# Patient Record
Sex: Female | Born: 1965 | Race: White | Hispanic: No | Marital: Married | Smoking: Never smoker
Health system: Southern US, Community
[De-identification: ages and names within clinical notes are randomized; demographics above are authoritative.]

## PROBLEM LIST (undated history)

## (undated) ENCOUNTER — Emergency Department (HOSPITAL_BASED_OUTPATIENT_CLINIC_OR_DEPARTMENT_OTHER): Admission: EM | Discharge status: Against Medical Advice | Payer: BC Managed Care – PPO | Source: Home / Self Care

## (undated) DIAGNOSIS — F32A Depression, unspecified: Secondary | ICD-10-CM

## (undated) DIAGNOSIS — F419 Anxiety disorder, unspecified: Secondary | ICD-10-CM

## (undated) DIAGNOSIS — R32 Unspecified urinary incontinence: Secondary | ICD-10-CM

## (undated) DIAGNOSIS — E349 Endocrine disorder, unspecified: Secondary | ICD-10-CM

## (undated) DIAGNOSIS — I10 Essential (primary) hypertension: Secondary | ICD-10-CM

## (undated) DIAGNOSIS — F329 Major depressive disorder, single episode, unspecified: Secondary | ICD-10-CM

## (undated) HISTORY — DX: Essential (primary) hypertension: I10

## (undated) HISTORY — DX: Major depressive disorder, single episode, unspecified: F32.9

## (undated) HISTORY — PX: TONSILLECTOMY: SUR1361

## (undated) HISTORY — DX: Unspecified urinary incontinence: R32

## (undated) HISTORY — DX: Anxiety disorder, unspecified: F41.9

## (undated) HISTORY — DX: Depression, unspecified: F32.A

## (undated) HISTORY — DX: Endocrine disorder, unspecified: E34.9

---
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
## 2000-11-23 HISTORY — PX: TOTAL VAGINAL HYSTERECTOMY: SHX2548

## 2000-11-23 HISTORY — PX: ABDOMINAL HYSTERECTOMY: SHX81

## 2012-10-19 ENCOUNTER — Encounter (HOSPITAL_COMMUNITY): Payer: Self-pay | Admitting: Emergency Medicine

## 2012-10-19 ENCOUNTER — Emergency Department (HOSPITAL_COMMUNITY)
Admission: EM | Admit: 2012-10-19 | Discharge: 2012-10-20 | Disposition: A | Discharge status: Home / Self Care | Payer: PRIVATE HEALTH INSURANCE | Attending: Emergency Medicine | Admitting: Emergency Medicine

## 2012-10-19 DIAGNOSIS — N201 Calculus of ureter: Secondary | ICD-10-CM | POA: Insufficient documentation

## 2012-10-19 DIAGNOSIS — R110 Nausea: Secondary | ICD-10-CM | POA: Insufficient documentation

## 2012-10-19 LAB — COMPREHENSIVE METABOLIC PANEL
ALT: 15 U/L (ref 0–35)
AST: 30 U/L (ref 0–37)
Alkaline Phosphatase: 85 U/L (ref 39–117)
CO2: 26 mEq/L (ref 19–32)
Chloride: 102 mEq/L (ref 96–112)
GFR calc non Af Amer: 58 mL/min — ABNORMAL LOW (ref >90)
Sodium: 141 mEq/L (ref 135–145)
Total Bilirubin: 0.2 mg/dL — ABNORMAL LOW (ref 0.3–1.2)

## 2012-10-19 LAB — CBC WITH DIFFERENTIAL/PLATELET
Basophils Absolute: 0 10*3/uL (ref 0.0–0.1)
HCT: 39.6 % (ref 36.0–46.0)
Lymphocytes Relative: 13 % (ref 12–46)
Monocytes Absolute: 0.6 10*3/uL (ref 0.1–1.0)
Neutro Abs: 10.8 10*3/uL — ABNORMAL HIGH (ref 1.7–7.7)
Platelets: 263 10*3/uL (ref 150–400)
RDW: 12.8 % (ref 11.5–15.5)
WBC: 13.1 10*3/uL — ABNORMAL HIGH (ref 4.0–10.5)

## 2012-10-19 MED ORDER — HYDROMORPHONE HCL PF 1 MG/ML IJ SOLN
0.5000 mg | Freq: Once | INTRAMUSCULAR | Status: AC
  Administered 2012-10-19: 0.5 mg via INTRAVENOUS
  Filled 2012-10-19: qty 1

## 2012-10-19 MED ORDER — SODIUM CHLORIDE 0.9 % IV SOLN
INTRAVENOUS | Status: DC
  Administered 2012-10-19: via INTRAVENOUS

## 2012-10-19 MED ORDER — ONDANSETRON HCL 4 MG/2ML IJ SOLN
4.0000 mg | Freq: Once | INTRAMUSCULAR | Status: AC
  Administered 2012-10-19: 4 mg via INTRAVENOUS
  Filled 2012-10-19: qty 2

## 2012-10-19 NOTE — ED Notes (Signed)
Patient complaining of right sided flank pain since Saturday.  Patient was seen by Urgent Care on Saturday; had urinalysis done -- was placed on an antibiotic.  Patient states that pain increasingly became worse today around 2200.  Patient denies history of kidney stones.

## 2012-10-19 NOTE — ED Provider Notes (Signed)
History     CSN: [id number]  Arrival date & time 10/19/12  2224   First MD Initiated Contact with Patient 10/19/12 2254      Chief Complaint  Patient presents with  . Flank Pain    (Consider location/radiation/quality/duration/timing/severity/associated sxs/prior treatment) Patient is a 46 y.o. female presenting with flank pain. The history is provided by the patient.  Flank Pain This is a new problem. Pertinent negatives include no chest pain, no abdominal pain, no headaches and no shortness of breath.   patient has had right back/flank pain for the last 5 days. She seen at urgent care and started on antibiotics. She was told she may have a kidney stone. She's continued to have pain. She's not had any dysuria. No frequency. She states she has had some difficulty urinating. No fevers. She has some nauseousness with the pain. The pain is severe and crampy. No diarrhea. No constipation. She's never previously had kidney stones.  History reviewed. No pertinent past medical history.  Past Surgical History  Procedure Date  . Tonsillectomy   . Abdominal hysterectomy     History reviewed. No pertinent family history.  History  Substance Use Topics  . Smoking status: Never Smoker   . Smokeless tobacco: Not on file  . Alcohol Use: Yes    OB History    Grav Para Term Preterm Abortions TAB SAB Ect Mult Living                  Review of Systems  Constitutional: Negative for activity change and appetite change.  HENT: Negative for neck stiffness.   Eyes: Negative for pain.  Respiratory: Negative for chest tightness and shortness of breath.   Cardiovascular: Negative for chest pain and leg swelling.  Gastrointestinal: Positive for nausea. Negative for vomiting, abdominal pain and diarrhea.  Genitourinary: Positive for flank pain. Negative for hematuria, vaginal bleeding and vaginal discharge.  Musculoskeletal: Negative for back pain.  Skin: Negative for rash.  Neurological:  Negative for weakness, numbness and headaches.  Psychiatric/Behavioral: Negative for behavioral problems.    Allergies  Review of patient's allergies indicates no known allergies.  Home Medications   Current Outpatient Rx  Name  Route  Sig  Dispense  Refill  . CIPROFLOXACIN HCL 500 MG PO TABS   Oral   Take 500 mg by mouth 2 (two) times daily. 5 pills left         . CITALOPRAM HYDROBROMIDE 20 MG PO TABS   Oral   Take 20 mg by mouth daily.         Marland Kitchen HYDROCHLOROTHIAZIDE 25 MG PO TABS   Oral   Take 25 mg by mouth daily.         Marland Kitchen HYDROCODONE-ACETAMINOPHEN 5-500 MG PO TABS   Oral   Take 1 tablet by mouth every 6 (six) hours as needed. For pain         . ONDANSETRON 8 MG PO TBDP   Oral   Take 1 tablet (8 mg total) by mouth every 8 (eight) hours as needed for nausea.   10 tablet   0   . OXYCODONE-ACETAMINOPHEN 5-325 MG PO TABS   Oral   Take 1-2 tablets by mouth every 6 (six) hours as needed for pain.   20 tablet   0   . TAMSULOSIN HCL 0.4 MG PO CAPS   Oral   Take 1 capsule (0.4 mg total) by mouth daily.   7 capsule   0  BP 139/80  Pulse 109  Temp 97.9 F (36.6 C) (Oral)  Resp 20  SpO2 99%  Physical Exam  Nursing note and vitals reviewed. Constitutional: She is oriented to person, place, and time. She appears well-developed and well-nourished.       Patient was standing in the room and appears uncomfortable.   HENT:  Head: Normocephalic and atraumatic.  Eyes: EOM are normal. Pupils are equal, round, and reactive to light.  Neck: Normal range of motion. Neck supple.  Cardiovascular: Normal rate, regular rhythm and normal heart sounds.   No murmur heard. Pulmonary/Chest: Effort normal and breath sounds normal. No respiratory distress. She has no wheezes. She has no rales.  Abdominal: Soft. Bowel sounds are normal. She exhibits no distension. There is no tenderness. There is no rebound and no guarding.  Genitourinary:       No CVA tenderness. No rash   Musculoskeletal: Normal range of motion.  Neurological: She is alert and oriented to person, place, and time. No cranial nerve deficit.  Skin: Skin is warm and dry.  Psychiatric: She has a normal mood and affect. Her speech is normal.    ED Course  Procedures (including critical care time)  Labs Reviewed  CBC WITH DIFFERENTIAL - Abnormal; Notable for the following:    WBC 13.1 (*)     Neutrophils Relative 82 (*)     Neutro Abs 10.8 (*)     All other components within normal limits  COMPREHENSIVE METABOLIC PANEL - Abnormal; Notable for the following:    Glucose, Bld 114 (*)     BUN 25 (*)     Creatinine, Ser 1.11 (*)     Total Bilirubin 0.2 (*)     GFR calc non Af Amer 58 (*)     GFR calc Af Amer 67 (*)     All other components within normal limits  URINALYSIS, ROUTINE W REFLEX MICROSCOPIC - Abnormal; Notable for the following:    Hgb urine dipstick SMALL (*)     Ketones, ur 15 (*)     All other components within normal limits  URINE MICROSCOPIC-ADD ON - Abnormal; Notable for the following:    Squamous Epithelial / LPF FEW (*)     All other components within normal limits  URINE CULTURE   Ct Abdomen Pelvis Wo Contrast  10/20/2012  *RADIOLOGY REPORT*  Clinical Data: Right-sided flank pain.  CT ABDOMEN AND PELVIS WITHOUT CONTRAST  Technique:  Multidetector CT imaging of the abdomen and pelvis was performed following the standard protocol without intravenous contrast.  Comparison: No priors.  Findings:  Lung Bases: Unremarkable.  Abdomen/Pelvis:  Image 47 of series 2 demonstrates a 4 mm calculus in the proximal third of the right ureter just distal to the right ureteropelvic junction.  This is associated with mild right-sided hydronephrosis and perinephric stranding.  There is also a 3 mm nonobstructive calculus in the interpolar region of the left kidney.  The unenhanced appearance of the liver, gallbladder, radius, spleen and bilateral adrenal glands is unremarkable.  A tiny  umbilical hernia containing only a small amount of omental fat.  No ascites or pneumoperitoneum and no pathologic distension of small bowel. No definite pathologic lymphadenopathy in the pelvis on this noncontrast CT examination.  Status post hysterectomy.  Ovaries are unremarkable in appearance.  Urinary bladder is normal in appearance.  Musculoskeletal: There are no aggressive appearing lytic or blastic lesions noted in the visualized portions of the skeleton.  IMPRESSION: 1.  Partially obstructive  4 mm calculus in the proximal third of the right ureter (just distal to the ureteropelvic junction) with mild hydronephrosis and perinephric stranding indicative of mild obstruction at this time. 2.  3 mm nonobstructive calculus of the interpolar collecting system of the left kidney. 3.  Tiny umbilical hernia containing only a small amount of omental fat. 4.  Normal appendix.   Original Report Authenticated By: Trudie Reed, M.D.      1. Right ureteral stone       MDM  Patient with flank pain. Can't have a ureteral stone on CT. Pain was poorly controlled with Dilaudid. After discussion with urology since it is a moderately sized proximal stone Toradol was given. Patient feels better and feels as if the pain will be able to be managed at home. She'll be discharged with Percocet, Flomax, and Zofran. She will followup as needed.        Juliet Rude. Rubin Payor, MD 10/20/12 0500

## 2012-10-20 ENCOUNTER — Encounter (HOSPITAL_COMMUNITY): Payer: Self-pay | Admitting: Radiology

## 2012-10-20 ENCOUNTER — Emergency Department (HOSPITAL_COMMUNITY): Payer: PRIVATE HEALTH INSURANCE

## 2012-10-20 LAB — URINALYSIS, ROUTINE W REFLEX MICROSCOPIC
Bilirubin Urine: NEGATIVE
Glucose, UA: NEGATIVE mg/dL
Ketones, ur: 15 mg/dL — AB
Leukocytes, UA: NEGATIVE
Nitrite: NEGATIVE
Protein, ur: NEGATIVE mg/dL
Specific Gravity, Urine: 1.023 (ref 1.005–1.030)
Urobilinogen, UA: 0.2 mg/dL (ref 0.0–1.0)
pH: 5 (ref 5.0–8.0)

## 2012-10-20 LAB — URINE MICROSCOPIC-ADD ON

## 2012-10-20 MED ORDER — HYDROMORPHONE HCL PF 1 MG/ML IJ SOLN
1.0000 mg | Freq: Once | INTRAMUSCULAR | Status: AC
  Administered 2012-10-20: 1 mg via INTRAVENOUS
  Filled 2012-10-20: qty 1

## 2012-10-20 MED ORDER — TAMSULOSIN HCL 0.4 MG PO CAPS: 0.4000 mg | ORAL_CAPSULE | Freq: Every day | ORAL | Status: DC

## 2012-10-20 MED ORDER — ONDANSETRON HCL 4 MG/2ML IJ SOLN
4.0000 mg | Freq: Once | INTRAMUSCULAR | Status: AC
  Administered 2012-10-20: 4 mg via INTRAVENOUS
  Filled 2012-10-20: qty 2

## 2012-10-20 MED ORDER — OXYCODONE-ACETAMINOPHEN 5-325 MG PO TABS: 1.0000 | ORAL_TABLET | Freq: Four times a day (QID) | ORAL | Status: DC | PRN

## 2012-10-20 MED ORDER — OXYCODONE-ACETAMINOPHEN 5-325 MG PO TABS
1.0000 | ORAL_TABLET | Freq: Once | ORAL | Status: AC
  Administered 2012-10-20: 1 via ORAL
  Filled 2012-10-20: qty 1

## 2012-10-20 MED ORDER — ONDANSETRON 8 MG PO TBDP: 8.0000 mg | ORAL_TABLET | Freq: Three times a day (TID) | ORAL | Status: DC | PRN

## 2012-10-20 MED ORDER — KETOROLAC TROMETHAMINE 30 MG/ML IJ SOLN
30.0000 mg | Freq: Once | INTRAMUSCULAR | Status: AC
  Administered 2012-10-20: 30 mg via INTRAVENOUS
  Filled 2012-10-20: qty 1

## 2012-10-20 NOTE — ED Notes (Signed)
Patient transported to CT 

## 2012-10-20 NOTE — ED Notes (Signed)
Pt provided with urine strainer upon discharge. Pt. To follow up with [facility]. Pt comfortable with discharge instructions.

## 2012-10-21 LAB — URINE CULTURE

## 2015-12-24 DIAGNOSIS — R1011 Right upper quadrant pain: Secondary | ICD-10-CM | POA: Insufficient documentation

## 2016-01-04 DIAGNOSIS — R55 Syncope and collapse: Secondary | ICD-10-CM | POA: Insufficient documentation

## 2016-01-04 DIAGNOSIS — I10 Essential (primary) hypertension: Secondary | ICD-10-CM | POA: Insufficient documentation

## 2017-01-13 DIAGNOSIS — R7301 Impaired fasting glucose: Secondary | ICD-10-CM | POA: Insufficient documentation

## 2017-01-13 DIAGNOSIS — E669 Obesity, unspecified: Secondary | ICD-10-CM | POA: Insufficient documentation

## 2017-05-31 ENCOUNTER — Encounter: Payer: Self-pay | Admitting: Podiatry

## 2017-05-31 ENCOUNTER — Ambulatory Visit (INDEPENDENT_AMBULATORY_CARE_PROVIDER_SITE_OTHER): Payer: BC Managed Care – PPO

## 2017-05-31 ENCOUNTER — Ambulatory Visit (INDEPENDENT_AMBULATORY_CARE_PROVIDER_SITE_OTHER): Payer: BC Managed Care – PPO | Admitting: Podiatry

## 2017-05-31 DIAGNOSIS — M722 Plantar fascial fibromatosis: Secondary | ICD-10-CM

## 2017-05-31 MED ORDER — DICLOFENAC SODIUM 75 MG PO TBEC: 75.0000 mg | DELAYED_RELEASE_TABLET | Freq: Two times a day (BID) | ORAL | 2 refills | Status: DC

## 2017-05-31 MED ORDER — TRIAMCINOLONE ACETONIDE 10 MG/ML IJ SUSP: 10.0000 mg | Freq: Once | INTRAMUSCULAR | Status: DC

## 2017-05-31 NOTE — Progress Notes (Signed)
   Subjective:    Patient ID: Brianna ParsonsKaren Odom, female    DOB: 28-Jan-1966, 51 y.o.   MRN: [id number]  HPI  Chief Complaint  Patient presents with  . Plantar Fasciitis    Lt bottom heel pain for over 1 year.     Review of Systems  Musculoskeletal: Positive for gait problem.       Objective:   Physical Exam        Assessment & Plan:

## 2017-05-31 NOTE — Patient Instructions (Signed)

## 2017-06-01 NOTE — Progress Notes (Signed)
Subjective:    Patient ID: Brianna Odom, female   DOB: 51 y.o.   MRN: [id number]   HPI patient states she's had a one year history of pain in the plantar aspect left heel that's gradually worsened over the last few months and is worse after periods of sitting or when getting up in the morning    Review of Systems  All other systems reviewed and are negative.       Objective:  Physical Exam  Cardiovascular: Intact distal pulses.   Musculoskeletal: Normal range of motion.  Neurological: She is alert.  Skin: Skin is warm.  Nursing note and vitals reviewed.  Neurovascular status intact muscle strength adequate range of motion within normal limits with patient found to have exquisite discomfort in the left plantar fashion insertional point tendon calcaneus with fluid buildup and moderate depression of the arch noted. Found have mild equinus good digital perfusion and well oriented 3     Assessment:  Acute plantar fasciitis left with chronic nature also to condition       Plan:    H&P condition reviewed and injected the left plantar fashion 3 mg Kenalog 5 mill grams Xylocaine and applied fascial brace with instructions on usage. Placed on diclofenac 75 mg twice a day instructed on physical therapy and reappoint to recheck in the next several weeks  X-rays indicate there is spur formation with no indication of stress fracture or arthritis

## 2017-06-07 ENCOUNTER — Telehealth: Payer: Self-pay | Admitting: Podiatry

## 2017-06-07 MED ORDER — NONFORMULARY OR COMPOUNDED ITEM: 2 refills | Status: DC

## 2017-06-07 NOTE — Telephone Encounter (Addendum)
I instructed pt to stop the Diclofenac and Dr. Charlsie Merlesegal [facility] when pt's don't tolerated oral antiinflammatories. I told [facility] should contact her in the next 24-48 hours to continue the ice therapy 3-4 times daily for 15-20 minutes per session and protect skin from the ice pack with fabric. Pt states understanding. I told pt Dr. Charlsie Merlesegal would like her to continue antiinflammatory therapy even if she was not able to tolerate the oral, so as to knock the inflammation out. Dr. Charlsie Merlesegal standard order for intolerance to oral antiinflammatory, [facility] Antiinflammatory Cream.

## 2017-06-07 NOTE — Telephone Encounter (Signed)
I'm sorry, I meant to. It was a mistake.

## 2017-06-07 NOTE — Telephone Encounter (Signed)
Dr. Charlsie Merlesegal prescribed diclofenac 75 MG tablets for my plantar fasciitis. It is causing me to have severe cramping and diarrhea which is preventing me from working. I was wondering if there is an alternative I could take. If you could, please call me back at [phone]. Thank you.

## 2017-06-21 ENCOUNTER — Encounter: Payer: Self-pay | Admitting: Podiatry

## 2017-06-21 ENCOUNTER — Ambulatory Visit (INDEPENDENT_AMBULATORY_CARE_PROVIDER_SITE_OTHER): Payer: BC Managed Care – PPO | Admitting: Podiatry

## 2017-06-21 DIAGNOSIS — M722 Plantar fascial fibromatosis: Secondary | ICD-10-CM

## 2017-06-22 ENCOUNTER — Telehealth: Payer: Self-pay | Admitting: Podiatry

## 2017-06-22 NOTE — Telephone Encounter (Signed)
Pt returned Dawn's phone call. Stated she is aware of the cost as $398 and she still wants to proceed with the orthotics. Would like them ordered today.

## 2017-06-22 NOTE — Telephone Encounter (Signed)
Pt called wanting us to go ahead and process the mold of her foot so she can get the orthotics. If you would like to give me a call back, that would be great. My number is [phone].

## 2017-06-22 NOTE — Progress Notes (Signed)
Subjective:    Patient ID: Brianna ParsonsKaren Hyams, female   DOB: 51 y.o.   MRN: [id number]   HPI patient presents stating that her heel is still hurting her some but it's improved from previous visit    ROS      Objective:  Physical Exam neurovascular status intact with continued discomfort left plantar fashion at the insertional point tendon into the calcaneus with moderate inflammation and pain noted     Assessment:    Plantar fasciitis still noted left heel at the insertional point tendon the calcaneus with improvement     Plan:     Recommended long-term orthotics and she will discussed with her husband and went ahead today and advised on physical therapy supportive shoes and patient be seen back to recheck

## 2017-07-08 ENCOUNTER — Telehealth: Payer: Self-pay | Admitting: Podiatry

## 2017-07-08 NOTE — Telephone Encounter (Signed)
Orthotics in.The patient left the office before the visit was finished. Left message for pt to call to schedule an appt to [facility].

## 2017-07-19 ENCOUNTER — Ambulatory Visit (INDEPENDENT_AMBULATORY_CARE_PROVIDER_SITE_OTHER): Payer: Self-pay | Admitting: Podiatry

## 2017-07-19 DIAGNOSIS — M722 Plantar fascial fibromatosis: Secondary | ICD-10-CM

## 2017-07-19 NOTE — Progress Notes (Signed)
Patient came in today to p/up CMFO.  She liked the fit, however, they didn't fit well in her [employer] shoes.  She was advised to purchase shoes with a wider toe box.    She does not want to file against insurance and wishes to pay $300.00 self pay/admin.

## 2017-07-29 LAB — HEMOGLOBIN A1C: Hemoglobin A1C: 5.8

## 2017-07-29 LAB — VITAMIN D 25 HYDROXY (VIT D DEFICIENCY, FRACTURES): VIT D 25 HYDROXY: 34.8

## 2017-09-06 ENCOUNTER — Ambulatory Visit (INDEPENDENT_AMBULATORY_CARE_PROVIDER_SITE_OTHER): Payer: BC Managed Care – PPO | Admitting: Family Medicine

## 2017-09-06 ENCOUNTER — Encounter: Payer: Self-pay | Admitting: Family Medicine

## 2017-09-06 DIAGNOSIS — I10 Essential (primary) hypertension: Secondary | ICD-10-CM | POA: Diagnosis not present

## 2017-09-06 DIAGNOSIS — R7303 Prediabetes: Secondary | ICD-10-CM | POA: Diagnosis not present

## 2017-09-06 DIAGNOSIS — M722 Plantar fascial fibromatosis: Secondary | ICD-10-CM | POA: Diagnosis not present

## 2017-09-06 DIAGNOSIS — Z90710 Acquired absence of both cervix and uterus: Secondary | ICD-10-CM | POA: Diagnosis not present

## 2017-09-06 DIAGNOSIS — F411 Generalized anxiety disorder: Secondary | ICD-10-CM

## 2017-09-06 NOTE — Patient Instructions (Signed)
Come in for fasting bldwrk- near future.  Please realize, EXERCISE IS MEDICINE!  -  American Heart Association [facility]) guidelines for exercise : If you are in good health, without any medical conditions, you should engage in 150 minutes of moderate intensity aerobic activity per week.  This means you should be huffing and puffing throughout your workout.   Engaging in regular exercise will improve brain function and memory, as well as improve mood, boost immune system and help with weight management.  As well as the other, more well-known effects of exercise such as decreasing blood sugar levels, decreasing blood pressure,  and decreasing bad cholesterol levels/ increasing good cholesterol levels.     -  The AHA strongly endorses consumption of a diet that contains a variety of foods from all the food categories with an emphasis on fruits and vegetables; fat-free and low-fat dairy products; cereal and grain products; legumes and nuts; and fish, poultry, and/or extra lean meats.    Excessive food intake, especially of foods high in saturated and trans fats, sugar, and salt, should be avoided.    Adequate water intake of roughly 1/2 of your weight in pounds, should equal the ounces of water per day you should drink.  So for instance, if you're 200 pounds, that would be 100 ounces of water per day.         Mediterranean Diet  Why follow it? Research shows. . Those who follow the Mediterranean diet have a reduced risk of heart disease  . The diet is associated with a reduced incidence of Parkinson's and Alzheimer's diseases . People following the diet may have longer life expectancies and lower rates of chronic diseases  . The Dietary Guidelines for Americans recommends the Mediterranean diet as an eating plan to promote health and prevent disease  What Is the Mediterranean Diet?  . Healthy eating plan based on typical foods and recipes of Mediterranean-style cooking . The diet is primarily a  plant based diet; these foods should make up a majority of meals   Starches - Plant based foods should make up a majority of meals - They are an important sources of vitamins, minerals, energy, antioxidants, and fiber - Choose whole grains, foods high in fiber and minimally processed items  - Typical grain sources include wheat, oats, barley, corn, brown rice, bulgar, farro, millet, polenta, couscous  - Various types of beans include chickpeas, lentils, fava beans, black beans, white beans   Fruits  Veggies - Large quantities of antioxidant rich fruits & veggies; 6 or more servings  - Vegetables can be eaten raw or lightly drizzled with oil and cooked  - Vegetables common to the traditional Mediterranean Diet include: artichokes, arugula, beets, broccoli, brussel sprouts, cabbage, carrots, celery, collard greens, cucumbers, eggplant, kale, leeks, lemons, lettuce, mushrooms, okra, onions, peas, peppers, potatoes, pumpkin, radishes, rutabaga, shallots, spinach, sweet potatoes, turnips, zucchini - Fruits common to the Mediterranean Diet include: apples, apricots, avocados, cherries, clementines, dates, figs, grapefruits, grapes, melons, nectarines, oranges, peaches, pears, pomegranates, strawberries, tangerines  Fats - Replace butter and margarine with healthy oils, such as olive oil, canola oil, and tahini  - Limit nuts to no more than a handful a day  - Nuts include walnuts, almonds, pecans, pistachios, pine nuts  - Limit or avoid candied, honey roasted or heavily salted nuts - Olives are central to the Mediterranean diet - can be eaten whole or used in a variety of dishes   Meats Protein - Limiting red meat:  no more than a few times a month - When eating red meat: choose lean cuts and keep the portion to the size of deck of cards - Eggs: approx. 0 to 4 times a week  - Fish and lean poultry: at least 2 a week  - Healthy protein sources include, chicken, [place], lean beef, lamb - Increase intake  of seafood such as tuna, salmon, trout, mackerel, shrimp, scallops - Avoid or limit high fat processed meats such as sausage and bacon  Dairy - Include moderate amounts of low fat dairy products  - Focus on healthy dairy such as fat free yogurt, skim milk, low or reduced fat cheese - Limit dairy products higher in fat such as whole or 2% milk, cheese, ice cream  Alcohol - Moderate amounts of red wine is ok  - No more than 5 oz daily for women (all ages) and men older than age 73  - No more than 10 oz of wine daily for men younger than 34  Other - Limit sweets and other desserts  - Use herbs and spices instead of salt to flavor foods  - Herbs and spices common to the traditional Mediterranean Diet include: basil, bay leaves, chives, cloves, cumin, fennel, garlic, lavender, marjoram, mint, oregano, parsley, pepper, rosemary, sage, savory, sumac, tarragon, thyme   It's not just a diet, it's a lifestyle:  . The Mediterranean diet includes lifestyle factors typical of those in the region  . Foods, drinks and meals are best eaten with others and savored . Daily physical activity is important for overall good health . This could be strenuous exercise like running and aerobics . This could also be more leisurely activities such as walking, housework, yard-work, or taking the stairs . Moderation is the key; a balanced and healthy diet accommodates most foods and drinks . Consider portion sizes and frequency of consumption of certain foods   Meal Ideas & Options:  . Breakfast:  o Whole wheat toast or whole wheat English muffins with peanut butter & hard boiled egg o Steel cut oats topped with apples & cinnamon and skim milk  o Fresh fruit: banana, strawberries, melon, berries, peaches  o Smoothies: strawberries, bananas, greek yogurt, peanut butter o Low fat greek yogurt with blueberries and granola  o Egg white omelet with spinach and mushrooms o Breakfast couscous: whole wheat couscous,  apricots, skim milk, cranberries  . Sandwiches:  o Hummus and grilled vegetables (peppers, zucchini, squash) on whole wheat bread   o Grilled chicken on whole wheat pita with lettuce, tomatoes, cucumbers or tzatziki  o Tuna salad on whole wheat bread: tuna salad made with greek yogurt, olives, red peppers, capers, green onions o Garlic rosemary lamb pita: lamb sauted with garlic, rosemary, salt & pepper; add lettuce, cucumber, greek yogurt to pita - flavor with lemon juice and black pepper  . Seafood:  o Mediterranean grilled salmon, seasoned with garlic, basil, parsley, lemon juice and black pepper o Shrimp, lemon, and spinach whole-grain pasta salad made with low fat greek yogurt  o Seared scallops with lemon orzo  o Seared tuna steaks seasoned salt, pepper, coriander topped with tomato mixture of olives, tomatoes, olive oil, minced garlic, parsley, green onions and cappers  . Meats:  o Herbed greek chicken salad with kalamata olives, cucumber, feta  o Red bell peppers stuffed with spinach, bulgur, lean ground beef (or lentils) & topped with feta   o Kebabs: skewers of chicken, tomatoes, onions, zucchini, squash  o [place] burgers:  made with red onions, mint, dill, lemon juice, feta cheese topped with roasted red peppers . Vegetarian o Cucumber salad: cucumbers, artichoke hearts, celery, red onion, feta cheese, tossed in olive oil & lemon juice  o Hummus and whole grain pita points with a greek salad (lettuce, tomato, feta, olives, cucumbers, red onion) o Lentil soup with celery, carrots made with vegetable broth, garlic, salt and pepper  o Tabouli salad: parsley, bulgur, mint, scallions, cucumbers, tomato, radishes, lemon juice, olive oil, salt and pepper.

## 2017-09-06 NOTE — Progress Notes (Signed)
New patient office visit note:  Impression and Recommendations:    1. S/P hysterectomy   2. Essential hypertension   3. Generalized anxiety disorder   4. Prediabetes     The patient was counseled, risk factors were discussed, anticipatory guidance given.   Meds ordered this encounter  Medications  . Coenzyme Q10 (COQ10) 100 MG CAPS    Sig: Take 1 capsule by mouth daily.  . hydrochlorothiazide (HYDRODIURIL) 12.5 MG tablet    Sig: Take 1 tablet by mouth daily.    Refill:  1  . Turmeric 500 MG CAPS    Sig: Take 2 capsules by mouth daily.  . Cholecalciferol (VITAMIN D3) 5000 units CAPS    Sig: Take 1 capsule by mouth daily.  . Probiotic Product (PROBIOTIC DAILY PO)    Sig: Take 1 tablet by mouth daily.  . Multiple Vitamin (MULTIVITAMIN) capsule    Sig: Take 1 capsule by mouth daily.  Claris Gower Grape-Goldenseal (BERBERINE COMPLEX PO)    Sig: Take 2 tablets by mouth daily.  . Cyanocobalamin (VITAMIN B 12 PO)    Sig: Take 1 tablet by mouth daily.    Discontinued Medications   HYDROCHLOROTHIAZIDE (HYDRODIURIL) 25 MG TABLET    Take 25 mg by mouth daily.   NONFORMULARY OR COMPOUNDED ITEM    [facility]: Antiinflammatory Cream - Dilofenac 3%, Baclofen 2%, Lidocaine2%, apply 1-2 grams to affected area 3-4 times daily.    Modified Medications   No medications on file    Gross side effects, risk and benefits, and alternatives of medications discussed with patient.  Patient is aware that all medications have potential side effects and we are unable to predict every side effect or drug-drug interaction that may occur.  Expresses verbal understanding and consents to current therapy plan and treatment regimen.  No Follow-up on file.  Please see AVS handed out to patient at the end of our visit for further patient instructions/ counseling done pertaining to today's office visit.    Note: This document was prepared using Dragon voice recognition software and may  include unintentional dictation errors.  ----------------------------------------------------------------------------------------------------------------------    Subjective:    Chief complaint:   Chief Complaint  Patient presents with  . Establish Care     HPI: Brianna Odom is a pleasant 51 y.o. female who presents to [facility] today to review their medical history with me and establish care.   Married 2 yrs.  2 kids- no [employer]. Works [employer].   I asked the patient to review their chronic problem list with me to ensure everything was updated and accurate.    All recent office visits with other providers, any medical records that patient brought in etc  - I reviewed today.     Also asked pt to get me medical records from [facility] providers/ specialists that they had seen within the past 3-5 years- if they are in private practice and/or do not work for a [employer], [facility], [facility], [facility] or [facility] owned practice.  Told them to call their specialists to clarify this if they are not sure.   Prior PCP- [facility]- moved ito area recetnly  Problem  Essential Hypertension   Roughly 5 yrs or so.  -  3 diff meds; clonidine once daily-  More for anxiety then BP per pt-->  - Bp well controlled at home. 130/80's. Takes BP occ      Obesity (Bmi 35.0-39.9 Without Comorbidity)  Generalized  Anxiety Disorder   Overview:  Generalized Anxiety Disorder --> been on celexa 9 yrs or so.      S/P Hysterectomy  Vitamin D Deficiency   Overview:  Vitamin D Deficiency  10/1 IMO update   Prediabetes   Highest 6.2 A1c about 1 yr ago.  Seen and eval at [facility]- working in [employer] Readings from Last 3 Encounters:  09/06/17 225 lb 3.2 oz (102.2 kg)   BP Readings from Last 3 Encounters:  09/06/17 129/85  10/20/12 147/97   Pulse Readings from Last 3 Encounters:  09/06/17 76  10/20/12 89   BMI Readings from Last 3 Encounters:  09/06/17  39.89 kg/m    Patient Care Team    Relationship Specialty Notifications Start End  Thomasene Lot, DO PCP - General Family Medicine  08/10/17   Stanton Kidney, MD Consulting Physician Gastroenterology  09/06/17   Lenn Sink, [facility] Consulting Physician Podiatry  09/06/17   Ernestine Conrad, PT  Physical Therapy  09/06/17   Elizabeth Palau, FNP Nurse Practitioner Nurse Practitioner  09/06/17 09/06/17    Patient Active Problem List   Diagnosis Date Noted  . Essential hypertension 01/04/2016    Priority: High  . Obesity (BMI 35.0-39.9 without comorbidity) 01/13/2017    Priority: Medium  . Generalized anxiety disorder 09/17/2010    Priority: Medium  . S/P hysterectomy 09/06/2017    Priority: Low  . Vitamin D deficiency 10/15/2010    Priority: Low  . Prediabetes 09/06/2017  . IFG (impaired fasting glucose) 01/13/2017  . Vasomotor instability 01/04/2016  . Right upper quadrant abdominal pain 12/24/2015     Past Medical History:  Diagnosis Date  . Depression   . Hypertension      Past Medical History:  Diagnosis Date  . Depression   . Hypertension      Past Surgical History:  Procedure Laterality Date  . ABDOMINAL HYSTERECTOMY  2002  . BREAST BIOPSY  1981  . TONSILLECTOMY       Family History  Problem Relation Age of Onset  . Diabetes Father        pre  . Hypertension Father      History  Drug Use No     History  Alcohol Use  . 0.6 - 1.2 oz/week  . 1 - 2 Glasses of wine per week     History  Smoking Status  . Never Smoker  Smokeless Tobacco  . Never Used     Outpatient Encounter Prescriptions as of 09/06/2017  Medication Sig  . Barberry-Oreg Grape-Goldenseal (BERBERINE COMPLEX PO) Take 2 tablets by mouth daily.  . benazepril (LOTENSIN) 40 MG tablet Take by mouth.  . Cholecalciferol (VITAMIN D3) 5000 units CAPS Take 1 capsule by mouth daily.  . citalopram (CELEXA) 20 MG tablet Take 20 mg by mouth daily.  . cloNIDine (CATAPRES) 0.1 MG  tablet TAKE 1 TABLETS once A DAY AS DIRECTED.  . Coenzyme Q10 (COQ10) 100 MG CAPS Take 1 capsule by mouth daily.  . Cyanocobalamin (VITAMIN B 12 PO) Take 1 tablet by mouth daily.  Marland Kitchen estradiol (VIVELLE-DOT) 0.025 MG/24HR Place 1 patch onto the skin 2 (two) times a week.   . hydrochlorothiazide (HYDRODIURIL) 12.5 MG tablet Take 1 tablet by mouth daily.  . Multiple Vitamin (MULTIVITAMIN) capsule Take 1 capsule by mouth daily.  . Probiotic Product (PROBIOTIC DAILY PO) Take 1 tablet by mouth daily.  . progesterone (PROMETRIUM) 100 MG capsule   .  Turmeric 500 MG CAPS Take 2 capsules by mouth daily.  . [DISCONTINUED] hydrochlorothiazide (HYDRODIURIL) 25 MG tablet Take 25 mg by mouth daily.  . [DISCONTINUED] NONFORMULARY OR COMPOUNDED ITEM [facility]: Antiinflammatory Cream - Dilofenac 3%, Baclofen 2%, Lidocaine2%, apply 1-2 grams to affected area 3-4 times daily.   Facility-Administered Encounter Medications as of 09/06/2017  Medication  . triamcinolone acetonide (KENALOG) 10 MG/ML injection 10 mg    Allergies: Diclofenac   ROS   Objective:   Blood pressure 129/85, pulse 76, height  (1.6 m), weight 225 lb 3.2 oz (102.2 kg). Body mass index is 39.89 kg/m. General: Well Developed, well nourished, and in no acute distress.  Neuro: Alert and oriented x3, extra-ocular muscles intact, sensation grossly intact.  HEENT:[place]/AT, PERRLA, neck supple, No carotid bruits Skin: no gross rashes  Cardiac: Regular rate and rhythm Respiratory: Essentially clear to auscultation bilaterally. Not using accessory muscles, speaking in full sentences.  Abdominal: not grossly distended Musculoskeletal: Ambulates w/o diff, FROM * 4 ext.  Vasc: less 2 sec cap RF, warm and pink  Psych:  No HI/SI, judgement and insight good, Euthymic mood. Full Affect.    No results found for this or any previous visit (from the past 2160 hour(s)).

## 2017-12-08 ENCOUNTER — Encounter: Payer: Self-pay | Admitting: Adult Health

## 2017-12-08 ENCOUNTER — Ambulatory Visit: Payer: BC Managed Care – PPO | Admitting: Adult Health

## 2017-12-08 VITALS — BP 117/76 | HR 83 | Temp 98.2°F | Ht 63.0 in | Wt 226.2 lb

## 2017-12-08 DIAGNOSIS — Z8619 Personal history of other infectious and parasitic diseases: Secondary | ICD-10-CM | POA: Insufficient documentation

## 2017-12-08 DIAGNOSIS — R059 Cough, unspecified: Secondary | ICD-10-CM | POA: Insufficient documentation

## 2017-12-08 DIAGNOSIS — J0100 Acute maxillary sinusitis, unspecified: Secondary | ICD-10-CM | POA: Insufficient documentation

## 2017-12-08 DIAGNOSIS — R05 Cough: Secondary | ICD-10-CM

## 2017-12-08 MED ORDER — HYDROCOD POLST-CPM POLST ER 10-8 MG/5ML PO SUER: 5.0000 mL | Freq: Two times a day (BID) | ORAL | 0 refills | Status: DC | PRN

## 2017-12-08 MED ORDER — AMOXICILLIN-POT CLAVULANATE 875-125 MG PO TABS: 1.0000 | ORAL_TABLET | Freq: Two times a day (BID) | ORAL | 0 refills | Status: DC

## 2017-12-08 MED ORDER — FLUCONAZOLE 150 MG PO TABS: ORAL_TABLET | ORAL | 0 refills | Status: DC

## 2017-12-08 MED ORDER — FLUTICASONE PROPIONATE 50 MCG/ACT NA SUSP: 2.0000 | Freq: Every day | NASAL | 6 refills | Status: DC

## 2017-12-08 NOTE — Progress Notes (Signed)
Subjective:    Patient ID: Brianna ParsonsKaren Newcomer, female    DOB: Aug 31, 1966, 52 y.o.   MRN: [id number]  HPI:  Ms. Abran CantorFrye presents with thick/green nasal drainage, productive cough (thick/green mucus), sore throat 3/10, and maxillary sinus tenderness that all started last week and sx's have been worsening. She reports "teeth throbbing" and frontal HA (4/10) that both started yesterday. She reports sinusitis every few years and hx of recurrent candidiasis, esp with ABX use. She drinks > gallon of water/day and denies tobacco use. She denies CP/dyspnea/palpitaoins/N/V/D.  Patient Care Team    Relationship Specialty Notifications Start End  Thomasene Lotpalski, Deborah, DO PCP - General Family Medicine  08/10/17   Stanton Kidneyoledo, Teodoro K, MD Consulting Physician Gastroenterology  09/06/17   Lenn Sinkegal, Norman S, DPM Consulting Physician Podiatry  09/06/17   Head, Irineo AxonStacey M, PT  Physical Therapy  09/06/17     Patient Active Problem List   Diagnosis Date Noted  . Acute maxillary sinusitis 12/08/2017  . Cough in adult 12/08/2017  . Hx of candidiasis 12/08/2017  . S/P hysterectomy 09/06/2017  . Prediabetes 09/06/2017  . Plantar fascial fibromatosis of left foot 09/06/2017  . IFG (impaired fasting glucose) 01/13/2017  . Obesity (BMI 35.0-39.9 without comorbidity) 01/13/2017  . Essential hypertension 01/04/2016  . Vasomotor instability 01/04/2016  . Right upper quadrant abdominal pain 12/24/2015  . Vitamin D deficiency 10/15/2010  . Generalized anxiety disorder 09/17/2010     Past Medical History:  Diagnosis Date  . Depression   . Hypertension      Past Surgical History:  Procedure Laterality Date  . ABDOMINAL HYSTERECTOMY  2002  . BREAST BIOPSY  1981  . TONSILLECTOMY       Family History  Problem Relation Age of Onset  . Diabetes Father        pre  . Hypertension Father      Social History   Substance and Sexual Activity  Drug Use No     Social History   Substance and Sexual Activity  Alcohol  Use Yes  . Alcohol/week: 0.6 - 1.2 oz  . Types: 1 - 2 Glasses of wine per week     Social History   Tobacco Use  Smoking Status Never Smoker  Smokeless Tobacco Never Used     Outpatient Encounter Medications as of 12/08/2017  Medication Sig  . Barberry-Oreg Grape-Goldenseal (BERBERINE COMPLEX PO) Take 2 tablets by mouth daily.  . benazepril (LOTENSIN) 40 MG tablet Take by mouth.  . Cholecalciferol (VITAMIN D3) 5000 units CAPS Take 1 capsule by mouth daily.  . citalopram (CELEXA) 20 MG tablet Take 20 mg by mouth daily.  . cloNIDine (CATAPRES) 0.1 MG tablet TAKE 1 TABLETS once A DAY AS DIRECTED.  . Coenzyme Q10 (COQ10) 100 MG CAPS Take 1 capsule by mouth daily.  . Cyanocobalamin (VITAMIN B 12 PO) Take 1 tablet by mouth daily.  Marland Kitchen. estradiol (VIVELLE-DOT) 0.025 MG/24HR Place 1 patch onto the skin 2 (two) times a week.   . hydrochlorothiazide (HYDRODIURIL) 12.5 MG tablet Take 1 tablet by mouth daily.  . Multiple Vitamin (MULTIVITAMIN) capsule Take 1 capsule by mouth daily.  . Probiotic Product (PROBIOTIC DAILY PO) Take 1 tablet by mouth daily.  . progesterone (PROMETRIUM) 100 MG capsule   . Turmeric 500 MG CAPS Take 2 capsules by mouth daily.  Marland Kitchen. amoxicillin-clavulanate (AUGMENTIN) 875-125 MG tablet Take 1 tablet by mouth 2 (two) times daily.  . chlorpheniramine-HYDROcodone (TUSSIONEX) 10-8 MG/5ML SUER Take 5 mLs by mouth every 12 (twelve)  hours as needed for cough.  . fluconazole (DIFLUCAN) 150 MG tablet Take one tablet by mouth when yeast infection symptoms develop.  . fluticasone (FLONASE) 50 MCG/ACT nasal spray Place 2 sprays into both nostrils daily.   Facility-Administered Encounter Medications as of 12/08/2017  Medication  . triamcinolone acetonide (KENALOG) 10 MG/ML injection 10 mg    Allergies: Diclofenac  Body mass index is 40.07 kg/m.  Blood pressure 117/76, pulse 83, temperature 98.2 F (36.8 C), temperature source Oral, height 5\' 3"  (1.6 m), weight 226 lb 3.2 oz  (102.6 kg), SpO2 98 %.   Review of Systems  Constitutional: Positive for activity change and fatigue. Negative for appetite change, chills, diaphoresis, fever and unexpected weight change.  HENT: Positive for congestion, postnasal drip, rhinorrhea, sinus pressure, sinus pain, sore throat and voice change. Negative for sneezing, tinnitus and trouble swallowing.   Eyes: Negative for visual disturbance.  Respiratory: Positive for cough and chest tightness. Negative for shortness of breath, wheezing and stridor.   Cardiovascular: Negative for chest pain, palpitations and leg swelling.  Gastrointestinal: Negative for abdominal distention, abdominal pain, anal bleeding, constipation, diarrhea, nausea and vomiting.  Endocrine: Negative for cold intolerance, heat intolerance, polydipsia, polyphagia and polyuria.  Genitourinary: Negative for difficulty urinating and hematuria.  Neurological: Positive for headaches. Negative for dizziness.  Psychiatric/Behavioral: Positive for sleep disturbance.       Objective:   Physical Exam  Constitutional: She is oriented to person, place, and time. She appears well-developed and well-nourished. No distress.  HENT:  Head: Normocephalic and atraumatic.  Right Ear: Hearing, tympanic membrane, external ear and ear canal normal. Tympanic membrane is not erythematous and not bulging. No decreased hearing is noted.  Left Ear: Hearing, tympanic membrane, external ear and ear canal normal. Tympanic membrane is not erythematous and not bulging. No decreased hearing is noted.  Nose: Mucosal edema and rhinorrhea present. Right sinus exhibits maxillary sinus tenderness and frontal sinus tenderness. Left sinus exhibits maxillary sinus tenderness and frontal sinus tenderness.  Mouth/Throat: Uvula is midline and mucous membranes are normal. Posterior oropharyngeal edema and posterior oropharyngeal erythema present. No oropharyngeal exudate or tonsillar abscesses.  Eyes:  Conjunctivae are normal. Pupils are equal, round, and reactive to light.  Neck: Normal range of motion. Neck supple.  Cardiovascular: Normal rate, regular rhythm, normal heart sounds and intact distal pulses.  No murmur heard. Pulmonary/Chest: Effort normal and breath sounds normal. No respiratory distress. She has no wheezes. She has no rales. She exhibits no tenderness.  Lymphadenopathy:    She has no cervical adenopathy.  Neurological: She is alert and oriented to person, place, and time.  Skin: Skin is warm and dry. No rash noted. She is not diaphoretic. No erythema. No pallor.  Psychiatric: She has a normal mood and affect. Her behavior is normal. Judgment and thought content normal.  Nursing note and vitals reviewed.         Assessment & Plan:   1. Acute maxillary sinusitis, recurrence not specified   2. Cough in adult   3. Hx of candidiasis     Acute maxillary sinusitis Please take Augmentin, Tussionex, Flonase as directed. Continue you excellent water intake, increase rest/vit c- 2,000mg /daily. Work excuse provided, okay to return to work 12/11/17. Ifsymptoms persist after antibiotic completed, please call clinic.  Cough in adult [place] Controlled Substance Database reviewed- no contraindications noted. Use Tussionex as directed- no driving or drinking ETOH while taking.  Hx of candidiasis If yeast infection symptoms start, please take Diflucan.  FOLLOW-UP:  Return if symptoms worsen or fail to improve.

## 2017-12-08 NOTE — Assessment & Plan Note (Signed)
Please take Augmentin, Tussionex, Flonase as directed. Continue you excellent water intake, increase rest/vit c- 2,000mg /daily. Work excuse provided, okay to return to work 12/11/17. Ifsymptoms persist after antibiotic completed, please call clinic.

## 2017-12-08 NOTE — Patient Instructions (Addendum)
Sinusitis, Adult Sinusitis is soreness and inflammation of your sinuses. Sinuses are hollow spaces in the bones around your face. Your sinuses are located:  Around your eyes.  In the middle of your forehead.  Behind your nose.  In your cheekbones.  Your sinuses and nasal passages are lined with a stringy fluid (mucus). Mucus normally drains out of your sinuses. When your nasal tissues become inflamed or swollen, the mucus can become trapped or blocked so air cannot flow through your sinuses. This allows bacteria, viruses, and funguses to grow, which leads to infection. Sinusitis can develop quickly and last for 7?10 days (acute) or for more than 12 weeks (chronic). Sinusitis often develops after a cold. What are the causes? This condition is caused by anything that creates swelling in the sinuses or stops mucus from draining, including:  Allergies.  Asthma.  Bacterial or viral infection.  Abnormally shaped bones between the nasal passages.  Nasal growths that contain mucus (nasal polyps).  Narrow sinus openings.  Pollutants, such as chemicals or irritants in the air.  A foreign object stuck in the nose.  A fungal infection. This is rare.  What increases the risk? The following factors may make you more likely to develop this condition:  Having allergies or asthma.  Having had a recent cold or respiratory tract infection.  Having structural deformities or blockages in your nose or sinuses.  Having a weak immune system.  Doing a lot of swimming or diving.  Overusing nasal sprays.  Smoking.  What are the signs or symptoms? The main symptoms of this condition are pain and a feeling of pressure around the affected sinuses. Other symptoms include:  Upper toothache.  Earache.  Headache.  Bad breath.  Decreased sense of smell and taste.  A cough that may get worse at night.  Fatigue.  Fever.  Thick drainage from your nose. The drainage is often green and  it may contain pus (purulent).  Stuffy nose or congestion.  Postnasal drip. This is when extra mucus collects in the throat or back of the nose.  Swelling and warmth over the affected sinuses.  Sore throat.  Sensitivity to light.  How is this diagnosed? This condition is diagnosed based on symptoms, a medical history, and a physical exam. To find out if your condition is acute or chronic, your health care provider may:  Look in your nose for signs of nasal polyps.  Tap over the affected sinus to check for signs of infection.  View the inside of your sinuses using an imaging device that has a light attached (endoscope).  If your health care provider suspects that you have chronic sinusitis, you may also:  Be tested for allergies.  Have a sample of mucus taken from your nose (nasal culture) and checked for bacteria.  Have a mucus sample examined to see if your sinusitis is related to an allergy.  If your sinusitis does not respond to treatment and it lasts longer than 8 weeks, you may have an MRI or CT scan to check your sinuses. These scans also help to determine how severe your infection is. In rare cases, a bone biopsy may be done to rule out more serious types of fungal sinus disease. How is this treated? Treatment for sinusitis depends on the cause and whether your condition is chronic or acute. If a virus is causing your sinusitis, your symptoms will go away on their own within 10 days. You may be given medicines to relieve your symptoms,   including:  Topical nasal decongestants. They shrink swollen nasal passages and let mucus drain from your sinuses.  Antihistamines. These drugs block inflammation that is triggered by allergies. This can help to ease swelling in your nose and sinuses.  Topical nasal corticosteroids. These are nasal sprays that ease inflammation and swelling in your nose and sinuses.  Nasal saline washes. These rinses can help to get rid of thick mucus in  your nose.  If your condition is caused by bacteria, you will be given an antibiotic medicine. If your condition is caused by a fungus, you will be given an antifungal medicine. Surgery may be needed to correct underlying conditions, such as narrow nasal passages. Surgery may also be needed to remove polyps. Follow these instructions at home: Medicines  Take, use, or apply over-the-counter and prescription medicines only as told by your health care provider. These may include nasal sprays.  If you were prescribed an antibiotic medicine, take it as told by your health care provider. Do not stop taking the antibiotic even if you start to feel better. Hydrate and Humidify  Drink enough water to keep your urine clear or pale yellow. Staying hydrated will help to thin your mucus.  Use a cool mist humidifier to keep the humidity level in your home above 50%.  Inhale steam for 10-15 minutes, 3-4 times a day or as told by your health care provider. You can do this in the bathroom while a hot shower is running.  Limit your exposure to cool or dry air. Rest  Rest as much as possible.  Sleep with your head raised (elevated).  Make sure to get enough sleep each night. General instructions  Apply a warm, moist washcloth to your face 3-4 times a day or as told by your health care provider. This will help with discomfort.  Wash your hands often with soap and water to reduce your exposure to viruses and other germs. If soap and water are not available, use hand sanitizer.  Do not smoke. Avoid being around people who are smoking (secondhand smoke).  Keep all follow-up visits as told by your health care provider. This is important. Contact a health care provider if:  You have a fever.  Your symptoms get worse.  Your symptoms do not improve within 10 days. Get help right away if:  You have a severe headache.  You have persistent vomiting.  You have pain or swelling around your face or  eyes.  You have vision problems.  You develop confusion.  Your neck is stiff.  You have trouble breathing. This information is not intended to replace advice given to you by your health care provider. Make sure you discuss any questions you have with your health care provider. Document Released: 11/09/2005 Document Revised: 07/05/2016 Document Reviewed: 09/04/2015 Elsevier Interactive Patient Education  2018 [place].   Cough, Adult Coughing is a reflex that clears your throat and your airways. Coughing helps to heal and protect your lungs. It is normal to cough occasionally, but a cough that happens with other symptoms or lasts a long time may be a sign of a condition that needs treatment. A cough may last only 2-3 weeks (acute), or it may last longer than 8 weeks (chronic). What are the causes? Coughing is commonly caused by:  Breathing in substances that irritate your lungs.  A viral or bacterial respiratory infection.  Allergies.  Asthma.  Postnasal drip.  Smoking.  Acid backing up from the stomach into the esophagus (  gastroesophageal reflux).  Certain medicines.  Chronic lung problems, including COPD (or rarely, lung cancer).  Other medical conditions such as heart failure.  Follow these instructions at home: Pay attention to any changes in your symptoms. Take these actions to help with your discomfort:  Take medicines only as told by your health care provider. ? If you were prescribed an antibiotic medicine, take it as told by your health care provider. Do not stop taking the antibiotic even if you start to feel better. ? Talk with your health care provider before you take a cough suppressant medicine.  Drink enough fluid to keep your urine clear or pale yellow.  If the air is dry, use a cold steam vaporizer or humidifier in your bedroom or your home to help loosen secretions.  Avoid anything that causes you to cough at work or at home.  If your cough is  worse at night, try sleeping in a semi-upright position.  Avoid cigarette smoke. If you smoke, quit smoking. If you need help quitting, ask your health care provider.  Avoid caffeine.  Avoid alcohol.  Rest as needed.  Contact a health care provider if:  You have new symptoms.  You cough up pus.  Your cough does not get better after 2-3 weeks, or your cough gets worse.  You cannot control your cough with suppressant medicines and you are losing sleep.  You develop pain that is getting worse or pain that is not controlled with pain medicines.  You have a fever.  You have unexplained weight loss.  You have night sweats. Get help right away if:  You cough up blood.  You have difficulty breathing.  Your heartbeat is very fast. This information is not intended to replace advice given to you by your health care provider. Make sure you discuss any questions you have with your health care provider. Document Released: 05/08/2011 Document Revised: 04/16/2016 Document Reviewed: 01/16/2015 Elsevier Interactive Patient Education  2018 [employer]   Please take Augmentin, Tussionex, Flonase as directed. Continue you excellent water intake, increase rest/vit c- 2,000mg /daily. Work excuse provided, okay to return to work 12/11/17. If yeast infection symptoms start, please take Diflucan. Ifsymptoms persist after antibiotic completed, please call clinic.

## 2017-12-08 NOTE — Assessment & Plan Note (Signed)
If yeast infection symptoms start, please take Diflucan.

## 2017-12-08 NOTE — Assessment & Plan Note (Signed)
[place] Controlled Substance Database reviewed- no contraindications noted. Use Tussionex as directed- no driving or drinking ETOH while taking.

## 2017-12-13 ENCOUNTER — Telehealth: Payer: Self-pay

## 2017-12-13 ENCOUNTER — Other Ambulatory Visit: Payer: Self-pay | Admitting: Adult Health

## 2017-12-13 MED ORDER — AMOXICILLIN 500 MG PO TABS: 500.0000 mg | ORAL_TABLET | Freq: Two times a day (BID) | ORAL | 0 refills | Status: DC

## 2017-12-13 NOTE — Telephone Encounter (Signed)
Pt informed.  Pt expressed understanding and is agreeable.  T. Nelson, CMA  

## 2017-12-13 NOTE — Telephone Encounter (Signed)
Pt called stating that she believes the Augmentin is causing her to have explosive diarrhea.  Pt states that she was unable to leave the house yesterday because of it and believes that it was causing dehydration.  Pt states that she has been taking the medication with food.  She is requesting to be changed to plain amoxicillin, which she states has worked well for her in the past.  Please advise.  Brianna Odom. Aloni Chuang, CMA

## 2017-12-13 NOTE — Telephone Encounter (Signed)
Good Morning [name], Can you please call Ms. Brianna Odom and share: I discontinued Augmentin and sent in plain Amoxicillin 500mg  BID for 4 days (which would total 10 days of ABX) Please tell her to stay hydrated, follow BRAT diet, and take OTC Probiotic to help with the diarrhea. Thanks! Orpha BurKaty

## 2018-02-14 ENCOUNTER — Encounter: Payer: Self-pay | Admitting: Family Medicine

## 2018-02-14 ENCOUNTER — Ambulatory Visit (INDEPENDENT_AMBULATORY_CARE_PROVIDER_SITE_OTHER): Payer: BC Managed Care – PPO | Admitting: Family Medicine

## 2018-02-14 VITALS — BP 140/88 | HR 79 | Temp 98.4°F | Resp 17 | Wt 226.0 lb

## 2018-02-14 DIAGNOSIS — R05 Cough: Secondary | ICD-10-CM | POA: Diagnosis not present

## 2018-02-14 DIAGNOSIS — I10 Essential (primary) hypertension: Secondary | ICD-10-CM | POA: Diagnosis not present

## 2018-02-14 DIAGNOSIS — R062 Wheezing: Secondary | ICD-10-CM | POA: Diagnosis not present

## 2018-02-14 DIAGNOSIS — F411 Generalized anxiety disorder: Secondary | ICD-10-CM | POA: Diagnosis not present

## 2018-02-14 DIAGNOSIS — R059 Cough, unspecified: Secondary | ICD-10-CM

## 2018-02-14 DIAGNOSIS — J40 Bronchitis, not specified as acute or chronic: Secondary | ICD-10-CM | POA: Diagnosis not present

## 2018-02-14 MED ORDER — AMOXICILLIN 875 MG PO TABS: 875.0000 mg | ORAL_TABLET | Freq: Two times a day (BID) | ORAL | 0 refills | Status: DC

## 2018-02-14 MED ORDER — PREDNISONE 20 MG PO TABS: ORAL_TABLET | ORAL | 0 refills | Status: DC

## 2018-02-14 MED ORDER — HYDROCOD POLST-CPM POLST ER 10-8 MG/5ML PO SUER: 5.0000 mL | Freq: Two times a day (BID) | ORAL | 0 refills | Status: DC | PRN

## 2018-02-14 MED ORDER — CITALOPRAM HYDROBROMIDE 20 MG PO TABS: 20.0000 mg | ORAL_TABLET | Freq: Every day | ORAL | 1 refills | Status: DC

## 2018-02-14 MED ORDER — BENAZEPRIL HCL 40 MG PO TABS: 40.0000 mg | ORAL_TABLET | Freq: Every day | ORAL | 1 refills | Status: DC

## 2018-02-14 NOTE — Patient Instructions (Signed)
Please make a follow-up in the very near future to go over your chronic medical issues.    Your blood pressure was too high today but likely due to illness and you taking over-the-counter decongestants etc.  However this is too high to have you consistently at this pressure so please follow-up in 6-weeks or so for recheck of your blood pressure as well as to review mood and other chronic conditions.

## 2018-02-14 NOTE — Progress Notes (Signed)
Acute Care Office visit  Assessment and plan:  1. Essential hypertension   2. Bronchitis with tracheitis   3. Wheezing   4. Cough   5. Generalized anxiety disorder      1. Essential HTN- BP elevated in office today.  Likely elevated due to illness. Continue meds. Continue to monitor at home. Pt will follow up in 6 weeks. 2. Bronchitis with tracheitis 3. Wheezing 4. Cough -Start 875mg  Abx.  -Start tussionex.  -Start prednisone taper.  - Viral vs Allergic vs Bacterial causes for pt's symptoms reveiwed.    - Supportive care and various OTC medications discussed in addition to any prescribed. - Call or RTC if new symptoms, or if no improvement or worse over next several days.    5. Generalized anxiety disorder -refill meds.    Meds ordered this encounter  Medications  . benazepril (LOTENSIN) 40 MG tablet    Sig: Take 1 tablet (40 mg total) by mouth daily.    Dispense:  90 tablet    Refill:  1  . citalopram (CELEXA) 20 MG tablet    Sig: Take 1 tablet (20 mg total) by mouth daily.    Dispense:  90 tablet    Refill:  1  . amoxicillin (AMOXIL) 875 MG tablet    Sig: Take 1 tablet (875 mg total) by mouth 2 (two) times daily.    Dispense:  20 tablet    Refill:  0  . chlorpheniramine-HYDROcodone (TUSSIONEX) 10-8 MG/5ML SUER    Sig: Take 5 mLs by mouth every 12 (twelve) hours as needed for cough (cough, will cause drowsiness.).    Dispense:  200 mL    Refill:  0  . predniSONE (DELTASONE) 20 MG tablet    Sig: Take 3 pills a day for 2 days, 2 pills a day for 2 days, 1 pill a day for 2 days then one half pill a day for 2 days then off    Dispense:  14 tablet    Refill:  0    Gross side effects, risk and benefits, and alternatives of medications discussed with patient.  Patient is aware that all medications have potential side effects and we are unable to predict every sideeffect or drug-drug interaction that may occur.  Expresses verbal understanding and consents to current  therapy plan and treatment regiment.   Education and routine counseling performed. Handouts provided.  Anticipatory guidance and routine counseling done re: condition, txmnt options and need for follow up. All questions of patient's were answered.  Return for 6wks- HTN, mood etc f/up.  Please see AVS handed out to patient at the end of our visit for additional patient instructions/ counseling done pertaining to today's office visit.  Note: This document was partially repared using Dragon voice recognition software and may include unintentional dictation errors.   This document serves as a record of services personally performed by Thomasene Loteborah Ottavio Norem, DO. It was created on her behalf by Thelma BargeNick Cochran, a trained medical scribe. The creation of this record is based on the scribe's personal observations and the provider's statements to them.   I have reviewed the above medical documentation for accuracy and completeness and I concur.  Thomasene LotDeborah Andrienne Havener 02/14/18 4:56 PM   Subjective:    Chief Complaint  Patient presents with  . Cough    x 1 week. She has been taking DayQuil and Advil. Denies fever, chills or sweats.   . Facial Pain    HPI:  Pt presents with  Sx for 7 days.  C/o: constant, productive cough, wheezing, and SOB.   She states her symptoms usually stay in her throat and she sometimes gets a hoarse voice, but she denies this today. Her husband has similar symptoms. She traveled for work recently.   Denies: fever, chills, pain when she coughs    For symptoms patient has tried:  Mucinex, dayquil, advil.  Overall getting:   Worse  She gets diarrhea from augmentin. She has used amoxicillin in the past with good results. A Zpack does not work completely for her symptoms.    Patient Care Team    Relationship Specialty Notifications Start End  Thomasene Lot, DO PCP - General Family Medicine  08/10/17   Stanton Kidney, MD Consulting Physician Gastroenterology  09/06/17     Lenn Sink, DPM Consulting Physician Podiatry  09/06/17   Head, Irineo Axon, PT  Physical Therapy  09/06/17     Past medical history, Surgical history, Family history reviewed and noted below, Social history, Allergies, and Medications have been entered into the medical record, reviewed and changed as needed.   Allergies  Allergen Reactions  . Diclofenac Diarrhea    Abdominal cramping.    Review of Systems: - see above HPI for pertinent positives General:   No F/C, wt loss Pulm:   No DIB, pleuritic chest pain Card:  No CP, palpitations Abd:  No n/v/d or pain Ext:  No inc edema from baseline   Objective:   Blood pressure 140/88, pulse 79, temperature 98.4 F (36.9 C), temperature source Oral, resp. rate 17, weight 226 lb (102.5 kg), SpO2 96 %. Body mass index is 40.03 kg/m. General: Well Developed, well nourished, appropriate for stated age.  Neuro: Alert and oriented x3, extra-ocular muscles intact, sensation grossly intact.  HEENT: Normocephalic, atraumatic, pupils equal round reactive to light, neck supple, no masses, no painful lymphadenopathy, TM's intact B/L, no acute findings. Nares- patent, clear d/c, OP- clear, mild erythema, No TTP sinuses Skin: Warm and dry, no gross rash. Cardiac: RRR, S1 S2,  no murmurs rubs or gallops.  Respiratory: Not using accessory muscles, speaking in full sentences- unlabored. On forced exhalation, she has expiratory wheezes bilaterally, only on forced. Vascular:  No gross lower ext edema, cap RF less 2 sec. Psych: No HI/SI, judgement and insight good, Euthymic mood. Full Affect.

## 2018-05-17 ENCOUNTER — Encounter: Payer: Self-pay | Admitting: Family Medicine

## 2018-05-17 ENCOUNTER — Ambulatory Visit: Payer: BC Managed Care – PPO | Admitting: Family Medicine

## 2018-05-17 VITALS — BP 143/90 | HR 78 | Ht 63.0 in | Wt 224.7 lb

## 2018-05-17 DIAGNOSIS — R5383 Other fatigue: Secondary | ICD-10-CM | POA: Insufficient documentation

## 2018-05-17 DIAGNOSIS — G4452 New daily persistent headache (NDPH): Secondary | ICD-10-CM | POA: Insufficient documentation

## 2018-05-17 DIAGNOSIS — E559 Vitamin D deficiency, unspecified: Secondary | ICD-10-CM

## 2018-05-17 DIAGNOSIS — E669 Obesity, unspecified: Secondary | ICD-10-CM | POA: Diagnosis not present

## 2018-05-17 DIAGNOSIS — R7303 Prediabetes: Secondary | ICD-10-CM

## 2018-05-17 DIAGNOSIS — F411 Generalized anxiety disorder: Secondary | ICD-10-CM

## 2018-05-17 DIAGNOSIS — I10 Essential (primary) hypertension: Secondary | ICD-10-CM

## 2018-05-17 MED ORDER — AMLODIPINE-VALSARTAN-HCTZ 5-160-12.5 MG PO TABS: ORAL_TABLET | ORAL | 1 refills | Status: DC

## 2018-05-17 MED ORDER — CLONIDINE HCL 0.1 MG/24HR TD PTWK: 0.1000 mg | MEDICATED_PATCH | TRANSDERMAL | 1 refills | Status: DC

## 2018-05-17 NOTE — Progress Notes (Signed)
Pt here for an acute care OV today   Impression and Recommendations:    1. Poorly-controlled hypertension   2. New daily persistent headache-  likely secondary to acute increase in blood pressure   3. Fatigue, unspecified type   4. Obesity (BMI 35.0-39.9 without comorbidity)   5. Generalized anxiety disorder   6. Vitamin D deficiency   7. Prediabetes     STRONGLY ADVISED PATIENT TO MAKE A FULL CHRONIC FOLLOW-UP APPOINTMENT INSTEAD OF ACUTE VISIT.  1. Acutely High Blood Pressure w/ Hypertension  - Reviewed goal blood pressure of less than 130/80.  - Advised patient about multifaceted approach to blood pressure control, and that some medication changes and additions may need to be made to her treatment plan, especially as she gets older.  - Advised patient to work on alternatives to manage stress and lose weight.  - Ambulatory blood pressure monitoring encouraged.  Patient knows to sit for 15-20 minutes prior to measuring blood pressure.  She will return to clinic with blood pressure log for review next chronic office visit.  2. Preventative Health Maintenance  - BMI Counseling Explained to patient what BMI refers to, and what it means medically.    Told patient to think about it as a "medical risk stratification measurement" and how increasing BMI is associated with increasing risk/ or worsening state of various diseases such as hypertension, hyperlipidemia, diabetes, premature OA, depression etc.  American Heart Association guidelines for healthy diet, basically Mediterranean diet, and exercise guidelines of 30 minutes 5 days per week or more discussed in detail.  Health counseling performed.  All questions answered.  - Advised patient to continue working toward exercising to improve health.    - Lifestyle - Patient will begin with 15 minutes of activity daily.  Recommended that the patient eventually strive for at least 150 minutes of moderate cardiovascular activity  per week according to guidelines established by the [facility].   - Healthy dietary habits encouraged, including low-carb, and high amounts of lean protein in diet.   - Patient should also consume adequate amounts of water - half of body weight in oz of water per day.  3. Follow-Up - Patient will return in 4-6 weeks for chronic follow-up. - 2-3 days prior, she will come in for fasting blood work.  - Patient agrees to follow through with scheduling chronic follow-up.   Meds ordered this encounter  Medications  . cloNIDine (CATAPRES - DOSED IN MG/24 HR) 0.1 mg/24hr patch    Sig: Place 1 patch (0.1 mg total) onto the skin once a week.    Dispense:  4 patch    Refill:  1  . amLODIPine-Valsartan-HCTZ 5-160-12.5 MG TABS    Sig: Half tablet for 1 week then inc to 1 tablet daily (goal blood pressure less than 130/80)    Dispense:  30 tablet    Refill:  1    Orders Placed This Encounter  Procedures  . CBC with Differential/Platelet  . Comprehensive metabolic panel  . VITAMIN D 25 Hydroxy (Vit-D Deficiency, Fractures)  . TSH  . Lipid panel  . Hemoglobin A1c  . T4, free  . Insulin, Free and Total     Education and routine counseling performed. Handouts provided  Gross side effects, risk and benefits, and alternatives of medications and treatment plan in general discussed with patient.  Patient is aware that all medications have potential side effects and we are unable to predict every side effect or drug-drug interaction  that may occur.   Patient will call with any questions prior to using medication if they have concerns.  Expresses verbal understanding and consents to current therapy and treatment regimen.  No barriers to understanding were identified.  Red flag symptoms and signs discussed in detail.  Patient expressed understanding regarding what to do in case of emergency\urgent symptoms   Please see AVS handed out to patient at the end of our visit for further patient instructions/  counseling done pertaining to today's office visit.   Return for Chronic OV w me near future 4-6wks & FBW 2-3d prior.     Note: This document was prepared occasionally using Dragon voice recognition software and may include unintentional dictation errors in addition to a scribe.  This document serves as a record of services personally performed by Thomasene Lot, DO. It was created on her behalf by [name], a trained medical scribe. The creation of this record is based on the scribe's personal observations and the provider's statements to them.   I have reviewed the above medical documentation for accuracy and completeness and I concur.  Thomasene Lot 05/22/18 10:37 PM   -------------------------------------------------------------------------------------------------------    Subjective:    CC:  Chief Complaint  Patient presents with  . Hypertension    HPI: Brianna Odom is a 52 y.o. female who presents to [facility] today for issues as discussed below.  Acutely High Blood Pressure Patient notes that she's had elevated blood pressure for one week - running in the 150's.  At first she thought it was allergies; woke up with a headache every morning for the past seven days.  She's been checking her blood pressure every day for the past week or so.  She notes that she checked her BP this morning waking up, last night before she went to bed, and sometimes in the middle of the day.    She's been taking her medications as she's usually done, 40 mg of benazepril in the morning and clonidine at night.  States that she feels tired, "just loopy a little bit."  Notes feeling space-headed, light headed.  However, she push mowed the other day in 90 degrees, and walked the golf course with Maurine Minister last night with no alarming side-effects.  Denies chest pain, denies SOB, denies exertional excess sweating or intolerance to activity.  One night, she took  an Advil PM because she couldn't sleep, but hasn't had anything else.  Patient notes that she's lost three pounds, but overall her weight has been stable.   No problems updated.   Wt Readings from Last 3 Encounters:  05/17/18 224 lb 11.2 oz (101.9 kg)  02/14/18 226 lb (102.5 kg)  12/08/17 226 lb 3.2 oz (102.6 kg)   BP Readings from Last 3 Encounters:  05/17/18 (!) 143/90  02/14/18 140/88  12/08/17 117/76   BMI Readings from Last 3 Encounters:  05/17/18 39.80 kg/m  02/14/18 40.03 kg/m  12/08/17 40.07 kg/m     Patient Care Team    Relationship Specialty Notifications Start End  Thomasene Lot, DO PCP - General Family Medicine  08/10/17   Stanton Kidney, MD Consulting Physician Gastroenterology  09/06/17   Lenn Sink, [facility] Consulting Physician Podiatry  09/06/17   Head, Irineo Axon, PT  Physical Therapy  09/06/17      Patient Active Problem List   Diagnosis Date Noted  . Essential hypertension 01/04/2016    Priority: High  . Obesity (BMI 35.0-39.9 without  comorbidity) 01/13/2017    Priority: Medium  . Generalized anxiety disorder 09/17/2010    Priority: Medium  . S/P hysterectomy 09/06/2017    Priority: Low  . Vitamin D deficiency 10/15/2010    Priority: Low  . Fatigue 05/17/2018  . New daily persistent headache-  likely secondary to acute increase in blood pressure 05/17/2018  . Acute maxillary sinusitis 12/08/2017  . Cough in adult 12/08/2017  . Hx of candidiasis 12/08/2017  . Prediabetes 09/06/2017  . Plantar fascial fibromatosis of left foot 09/06/2017  . IFG (impaired fasting glucose) 01/13/2017  . Vasomotor instability 01/04/2016  . Right upper quadrant abdominal pain 12/24/2015    Past Medical history, Surgical history, Family history, Social history, Allergies and Medications have been entered into the medical record, reviewed and changed as needed.    Current Meds  Medication Sig  . Barberry-Oreg Grape-Goldenseal (BERBERINE COMPLEX PO) Take  2 tablets by mouth daily.  . Cholecalciferol (VITAMIN D3) 5000 units CAPS Take 1 capsule by mouth daily.  . citalopram (CELEXA) 20 MG tablet Take 1 tablet (20 mg total) by mouth daily.  . Coenzyme Q10 (COQ10) 100 MG CAPS Take 1 capsule by mouth daily.  . Multiple Vitamin (MULTIVITAMIN) capsule Take 1 capsule by mouth daily.  . Probiotic Product (PROBIOTIC DAILY PO) Take 1 tablet by mouth daily.  . progesterone (PROMETRIUM) 100 MG capsule   . Turmeric 500 MG CAPS Take 2 capsules by mouth daily.  . [DISCONTINUED] benazepril (LOTENSIN) 40 MG tablet Take 1 tablet (40 mg total) by mouth daily.  . [DISCONTINUED] cloNIDine (CATAPRES) 0.1 MG tablet TAKE 1 TABLETS once A DAY AS DIRECTED.   Current Facility-Administered Medications for the 05/17/18 encounter (Office Visit) with Thomasene Lotpalski, Phelan Goers, DO  Medication  . triamcinolone acetonide (KENALOG) 10 MG/ML injection 10 mg    Allergies:  Allergies  Allergen Reactions  . Diclofenac Diarrhea    Abdominal cramping.     Review of Systems: General:   Denies fever, chills, unexplained weight loss.  Optho/Auditory:   Denies visual changes, blurred vision/LOV Respiratory:   Denies wheeze, DOE more than baseline levels.  Cardiovascular:   Denies chest pain, palpitations, new onset peripheral edema  Gastrointestinal:   Denies nausea, vomiting, diarrhea, abd pain.  Genitourinary: Denies dysuria, freq/ urgency, flank pain or discharge from genitals.  Endocrine:     Denies hot or cold intolerance, polyuria, polydipsia. Musculoskeletal:   Denies unexplained myalgias, joint swelling, unexplained arthralgias, gait problems.  Skin:  Denies new onset rash, suspicious lesions Neurological:     Denies dizziness, unexplained weakness, numbness  Psychiatric/Behavioral:   Denies mood changes, suicidal or homicidal ideations, hallucinations    Objective:   Blood pressure (!) 143/90, pulse 78, height 5\' 3"  (1.6 m), weight 224 lb 11.2 oz (101.9 kg), SpO2 98  %. Body mass index is 39.8 kg/m. General:  Well Developed, well nourished, appropriate for stated age.  Neuro:  Alert and oriented,  extra-ocular muscles intact  HEENT:  Normocephalic, atraumatic, neck supple Skin:  no gross rash, warm, pink. Cardiac:  RRR, S1 S2 Respiratory:  ECTA B/L and A/P, Not using accessory muscles, speaking in full sentences- unlabored. Vascular:  Ext warm, no cyanosis apprec.; cap RF less 2 sec. Psych:  No HI/SI, judgement and insight good, Euthymic mood. Full Affect.

## 2018-05-17 NOTE — Patient Instructions (Signed)
How to Take Your Blood Pressure  Blood pressure is a measurement of how strongly your blood is pressing against the walls of your arteries. Arteries are blood vessels that carry blood from your heart throughout your body. Your health care provider takes your blood pressure at each office visit. You can also take your own blood pressure at home with a blood pressure machine. You may need to take your own blood pressure:   To confirm a diagnosis of high blood pressure (hypertension).   To monitor your blood pressure over time.   To make sure your blood pressure medicine is working.    Supplies needed:  To take your blood pressure, you will need a blood pressure machine. You can buy a blood pressure machine, or blood pressure monitor, at most drugstores or online. There are several types of home blood pressure monitors. When choosing one, consider the following:   Choose a monitor that has an arm cuff.   Choose a monitor that wraps snugly around your upper arm. You should be able to fit only one finger between your arm and the cuff.   Do not choose a monitor that measures your blood pressure from your wrist or finger.    Your health care provider can suggest a reliable monitor that will meet your needs.  How to prepare  To get the most accurate reading, avoid the following for 30 minutes before you check your blood pressure:   Drinking caffeine.   Drinking alcohol.   Eating.   Smoking.   Exercising.    Five minutes before you check your blood pressure:   Empty your bladder.   Sit quietly without talking in a dining chair, rather than in a soft couch or armchair.    How to take your blood pressure  To check your blood pressure, follow the instructions in the manual that came with your blood pressure monitor. If you have a digital blood pressure monitor, the instructions may be as follows:  1. Sit up straight.  2. Place your feet on the floor. Do not cross your ankles or legs.  3. Rest your left arm at the  level of your heart on a table or desk or on the arm of a chair.  4. Pull up your shirt sleeve.  5. Wrap the blood pressure cuff around the upper part of your left arm, 1 inch (2.5 cm) above your elbow. It is best to wrap the cuff around bare skin.  6. Fit the cuff snugly around your arm. You should be able to place only one finger between the cuff and your arm.  7. Position the cord inside the groove of your elbow.  8. Press the power button.  9. Sit quietly while the cuff inflates and deflates.  10. Read the digital reading on the monitor screen and write it down (record it).  11. Wait 2-3 minutes, then repeat the steps, starting at step 1.    What does my blood pressure reading mean?  A blood pressure reading consists of a higher number over a lower number. Ideally, your blood pressure should be below 120/80. The first ("top") number is called the systolic pressure. It is a measure of the pressure in your arteries as your heart beats. The second ("bottom") number is called the diastolic pressure. It is a measure of the pressure in your arteries as the heart relaxes.  Blood pressure is classified into four stages. The following are the stages for adults who do   pressure: below 80. Hypertension stage 1  Systolic pressure: 130-139.  Diastolic pressure: 80-89. Hypertension stage 2  Systolic pressure: 140 or above.  Diastolic pressure: 90 or above. You can have prehypertension or hypertension even if only the systolic or only the diastolic number in your reading is higher than normal. Follow these instructions at home:  Check your blood pressure as often as recommended by your health care provider.  Take your monitor to the next appointment  with your health care provider to make sure: ? That you are using it correctly. ? That it provides accurate readings.  Be sure you understand what your goal blood pressure numbers are.  Tell your health care provider if you are having any side effects from blood pressure medicine. Contact a health care provider if:  Your blood pressure is consistently high. Get help right away if:  Your systolic blood pressure is higher than 180.  Your diastolic blood pressure is higher than 110. This information is not intended to replace advice given to you by your health care provider. Make sure you discuss any questions you have with your health care provider. Document Released: 04/17/2016 Document Revised: 06/30/2016 Document Reviewed: 04/17/2016 Elsevier Interactive Patient Education  2018 [employer]     Hypertension Hypertension, commonly called high blood pressure, is when the force of blood pumping through the arteries is too strong. The arteries are the blood vessels that carry blood from the heart throughout the body. Hypertension forces the heart to work harder to pump blood and may cause arteries to become narrow or stiff. Having untreated or uncontrolled hypertension can cause heart attacks, strokes, kidney disease, and other problems. A blood pressure reading consists of a higher number over a lower number. Ideally, your blood pressure should be below 120/80. The first ("top") number is called the systolic pressure. It is a measure of the pressure in your arteries as your heart beats. The second ("bottom") number is called the diastolic pressure. It is a measure of the pressure in your arteries as the heart relaxes. What are the causes? The cause of this condition is not known. What increases the risk? Some risk factors for high blood pressure are under your control. Others are not. Factors you can change  Smoking.  Having type 2 diabetes mellitus, high cholesterol, or both.  Not  getting enough exercise or physical activity.  Being overweight.  Having too much fat, sugar, calories, or salt (sodium) in your diet.  Drinking too much alcohol. Factors that are difficult or impossible to change  Having chronic kidney disease.  Having a family history of high blood pressure.  Age. Risk increases with age.  Race. You may be at higher risk if you are African-American.  Gender. Men are at higher risk than women before age 54. After age 54, women are at higher risk than men.  Having obstructive sleep apnea.  Stress. What are the signs or symptoms? Extremely high blood pressure (hypertensive crisis) may cause:  Headache.  Anxiety.  Shortness of breath.  Nosebleed.  Nausea and vomiting.  Severe chest pain.  Jerky movements you cannot control (seizures).  How is this diagnosed? This condition is diagnosed by measuring your blood pressure while you are seated, with your arm resting on a surface. The cuff of the blood pressure monitor will be placed directly against the skin of your upper arm at the level of your heart. It should be measured at least twice using the same arm. Certain conditions can cause  a difference in blood pressure between your right and left arms. Certain factors can cause blood pressure readings to be lower or higher than normal (elevated) for a short period of time:  When your blood pressure is higher when you are in a health care provider's office than when you are at home, this is called white coat hypertension. Most people with this condition do not need medicines.  When your blood pressure is higher at home than when you are in a health care provider's office, this is called masked hypertension. Most people with this condition may need medicines to control blood pressure.  If you have a high blood pressure reading during one visit or you have normal blood pressure with other risk factors:  You may be asked to return on a different  day to have your blood pressure checked again.  You may be asked to monitor your blood pressure at home for 1 week or longer.  If you are diagnosed with hypertension, you may have other blood or imaging tests to help your health care provider understand your overall risk for other conditions. How is this treated? This condition is treated by making healthy lifestyle changes, such as eating healthy foods, exercising more, and reducing your alcohol intake. Your health care provider may prescribe medicine if lifestyle changes are not enough to get your blood pressure under control, and if:  Your systolic blood pressure is above 130.  Your diastolic blood pressure is above 80.  Your personal target blood pressure may vary depending on your medical conditions, your age, and other factors. Follow these instructions at home: Eating and drinking  Eat a diet that is high in fiber and potassium, and low in sodium, added sugar, and fat. An example eating plan is called the DASH (Dietary Approaches to Stop Hypertension) diet. To eat this way: ? Eat plenty of fresh fruits and vegetables. Try to fill half of your plate at each meal with fruits and vegetables. ? Eat whole grains, such as whole wheat pasta, brown rice, or whole grain bread. Fill about one quarter of your plate with whole grains. ? Eat or drink low-fat dairy products, such as skim milk or low-fat yogurt. ? Avoid fatty cuts of meat, processed or cured meats, and poultry with skin. Fill about one quarter of your plate with lean proteins, such as fish, chicken without skin, beans, eggs, and tofu. ? Avoid premade and processed foods. These tend to be higher in sodium, added sugar, and fat.  Reduce your daily sodium intake. Most people with hypertension should eat less than 1,500 mg of sodium a day.  Limit alcohol intake to no more than 1 drink a day for nonpregnant women and 2 drinks a day for men. One drink equals 12 oz of beer, 5 oz of wine,  or 1 oz of hard liquor. Lifestyle  Work with your health care provider to maintain a healthy body weight or to lose weight. Ask what an ideal weight is for you.  Get at least 30 minutes of exercise that causes your heart to beat faster (aerobic exercise) most days of the week. Activities may include walking, swimming, or biking.  Include exercise to strengthen your muscles (resistance exercise), such as pilates or lifting weights, as part of your weekly exercise routine. Try to do these types of exercises for 30 minutes at least 3 days a week.  Do not use any products that contain nicotine or tobacco, such as cigarettes and e-cigarettes. If you  need help quitting, ask your health care provider.  Monitor your blood pressure at home as told by your health care provider.  Keep all follow-up visits as told by your health care provider. This is important. Medicines  Take over-the-counter and prescription medicines only as told by your health care provider. Follow directions carefully. Blood pressure medicines must be taken as prescribed.  Do not skip doses of blood pressure medicine. Doing this puts you at risk for problems and can make the medicine less effective.  Ask your health care provider about side effects or reactions to medicines that you should watch for. Contact a health care provider if:  You think you are having a reaction to a medicine you are taking.  You have headaches that keep coming back (recurring).  You feel dizzy.  You have swelling in your ankles.  You have trouble with your vision. Get help right away if:  You develop a severe headache or confusion.  You have unusual weakness or numbness.  You feel faint.  You have severe pain in your chest or abdomen.  You vomit repeatedly.  You have trouble breathing. Summary  Hypertension is when the force of blood pumping through your arteries is too strong. If this condition is not controlled, it may put you at  risk for serious complications.  Your personal target blood pressure may vary depending on your medical conditions, your age, and other factors. For most people, a normal blood pressure is less than 120/80.  Hypertension is treated with lifestyle changes, medicines, or a combination of both. Lifestyle changes include weight loss, eating a healthy, low-sodium diet, exercising more, and limiting alcohol. This information is not intended to replace advice given to you by your health care provider. Make sure you discuss any questions you have with your health care provider. Document Released: 11/09/2005 Document Revised: 10/07/2016 Document Reviewed: 10/07/2016 Elsevier Interactive Patient Education  [employer].

## 2018-05-18 ENCOUNTER — Telehealth: Payer: Self-pay | Admitting: Family Medicine

## 2018-05-18 NOTE — Telephone Encounter (Signed)
Patient request provider or medical assistant call her she has questions regarding medicines.   PH# [phone]  --forwarding message.  --glh

## 2018-05-18 NOTE — Telephone Encounter (Signed)
Called patient and verified changes made in medications at office visit yesterday per office note and verbal conformation by Dr. Sharee Holsterpalski. MPulliam, CMA/RT(R)

## 2018-06-16 ENCOUNTER — Telehealth: Payer: Self-pay | Admitting: Family Medicine

## 2018-06-16 NOTE — Telephone Encounter (Signed)
Called and spoke to the patient and she states that she is having a red, raised, itchy area to the site of the patches over the last 10-14 days. Patient states that she is also having a hard time keeping them on.  Patient has tried mult spots on the body.  Please advise if the patient needs to stop the patches and start back on the tablets. MPulliam, CMA/RT(R)

## 2018-06-16 NOTE — Telephone Encounter (Signed)
Patient is on the Clonidine patch and is reporting an ongoing rash at the application site. She wants to speak with the nurse about what she should be doing or if she needs to switch back to an oral med. Please advise

## 2018-06-17 NOTE — Telephone Encounter (Signed)
Tell her to stop the patch since she appears to be having a skin reaction to it and go back on the clonidine tablets that she has 0.1 mg tablets and take them 3 times daily.  If blood pressure is not at goal by taking 1 of those tablets 3 times daily, she can take 2 tablets every 6-8 hours then to goal BP's as we discussed in her last office visit

## 2018-06-20 ENCOUNTER — Other Ambulatory Visit (INDEPENDENT_AMBULATORY_CARE_PROVIDER_SITE_OTHER): Payer: BC Managed Care – PPO

## 2018-06-20 DIAGNOSIS — R7301 Impaired fasting glucose: Secondary | ICD-10-CM

## 2018-06-20 DIAGNOSIS — R5383 Other fatigue: Secondary | ICD-10-CM

## 2018-06-20 DIAGNOSIS — I10 Essential (primary) hypertension: Secondary | ICD-10-CM

## 2018-06-20 DIAGNOSIS — E559 Vitamin D deficiency, unspecified: Secondary | ICD-10-CM

## 2018-06-20 DIAGNOSIS — R7303 Prediabetes: Secondary | ICD-10-CM

## 2018-06-20 NOTE — Telephone Encounter (Signed)
Patient notified. MPulliam, CMA/RT(R)  

## 2018-06-20 NOTE — Addendum Note (Signed)
Addended by: Leda MinPULLIAM, Cyla Haluska D on: 06/20/2018 09:06 AM   Modules accepted: Level of Service

## 2018-06-22 ENCOUNTER — Other Ambulatory Visit: Payer: BC Managed Care – PPO

## 2018-06-23 LAB — TSH: TSH: 3.69 u[IU]/mL (ref 0.450–4.500)

## 2018-06-23 LAB — CBC WITH DIFFERENTIAL/PLATELET
BASOS: 1 %
Basophils Absolute: 0 10*3/uL (ref 0.0–0.2)
EOS (ABSOLUTE): 0.1 10*3/uL (ref 0.0–0.4)
EOS: 3 %
HEMATOCRIT: 39.2 % (ref 34.0–46.6)
Hemoglobin: 12.9 g/dL (ref 11.1–15.9)
IMMATURE GRANS (ABS): 0 10*3/uL (ref 0.0–0.1)
IMMATURE GRANULOCYTES: 0 %
Lymphocytes Absolute: 1.3 10*3/uL (ref 0.7–3.1)
Lymphs: 28 %
MCH: 28.4 pg (ref 26.6–33.0)
MCHC: 32.9 g/dL (ref 31.5–35.7)
MCV: 86 fL (ref 79–97)
MONOS ABS: 0.4 10*3/uL (ref 0.1–0.9)
Monocytes: 8 %
NEUTROS PCT: 60 %
Neutrophils Absolute: 2.8 10*3/uL (ref 1.4–7.0)
Platelets: 245 10*3/uL (ref 150–450)
RBC: 4.55 x10E6/uL (ref 3.77–5.28)
RDW: 13.2 % (ref 12.3–15.4)
WBC: 4.7 10*3/uL (ref 3.4–10.8)

## 2018-06-23 LAB — COMPREHENSIVE METABOLIC PANEL
ALBUMIN: 4.2 g/dL (ref 3.5–5.5)
ALT: 29 IU/L (ref 0–32)
AST: 23 IU/L (ref 0–40)
Albumin/Globulin Ratio: 2 (ref 1.2–2.2)
Alkaline Phosphatase: 94 IU/L (ref 39–117)
BUN / CREAT RATIO: 19 (ref 9–23)
BUN: 14 mg/dL (ref 6–24)
Bilirubin Total: 0.3 mg/dL (ref 0.0–1.2)
CALCIUM: 9.3 mg/dL (ref 8.7–10.2)
CO2: 24 mmol/L (ref 20–29)
CREATININE: 0.73 mg/dL (ref 0.57–1.00)
Chloride: 105 mmol/L (ref 96–106)
GFR calc Af Amer: 108 mL/min/{1.73_m2} (ref >59)
GFR, EST NON AFRICAN AMERICAN: 94 mL/min/{1.73_m2} (ref >59)
GLOBULIN, TOTAL: 2.1 g/dL (ref 1.5–4.5)
Glucose: 103 mg/dL — ABNORMAL HIGH (ref 65–99)
Potassium: 4.5 mmol/L (ref 3.5–5.2)
SODIUM: 142 mmol/L (ref 134–144)
Total Protein: 6.3 g/dL (ref 6.0–8.5)

## 2018-06-23 LAB — LIPID PANEL
CHOLESTEROL TOTAL: 203 mg/dL — AB (ref 100–199)
Chol/HDL Ratio: 3 ratio (ref 0.0–4.4)
HDL: 67 mg/dL (ref >39)
LDL Calculated: 116 mg/dL — ABNORMAL HIGH (ref 0–99)
TRIGLYCERIDES: 101 mg/dL (ref 0–149)
VLDL Cholesterol Cal: 20 mg/dL (ref 5–40)

## 2018-06-23 LAB — T4, FREE: FREE T4: 1.27 ng/dL (ref 0.82–1.77)

## 2018-06-23 LAB — VITAMIN D 25 HYDROXY (VIT D DEFICIENCY, FRACTURES): Vit D, 25-Hydroxy: 35.5 ng/mL (ref 30.0–100.0)

## 2018-06-23 LAB — INSULIN, FREE AND TOTAL
Free Insulin: 11 uU/mL
TOTAL INSULIN: 11 uU/mL

## 2018-06-27 ENCOUNTER — Encounter: Payer: Self-pay | Admitting: Family Medicine

## 2018-06-27 ENCOUNTER — Ambulatory Visit: Payer: BC Managed Care – PPO | Admitting: Family Medicine

## 2018-06-27 VITALS — BP 128/83 | HR 72 | Ht 63.0 in | Wt 227.0 lb

## 2018-06-27 DIAGNOSIS — T148XXA Other injury of unspecified body region, initial encounter: Secondary | ICD-10-CM

## 2018-06-27 DIAGNOSIS — E669 Obesity, unspecified: Secondary | ICD-10-CM | POA: Diagnosis not present

## 2018-06-27 DIAGNOSIS — R7303 Prediabetes: Secondary | ICD-10-CM | POA: Diagnosis not present

## 2018-06-27 DIAGNOSIS — R1013 Epigastric pain: Secondary | ICD-10-CM

## 2018-06-27 DIAGNOSIS — L910 Hypertrophic scar: Secondary | ICD-10-CM | POA: Insufficient documentation

## 2018-06-27 DIAGNOSIS — E559 Vitamin D deficiency, unspecified: Secondary | ICD-10-CM | POA: Diagnosis not present

## 2018-06-27 DIAGNOSIS — L247 Irritant contact dermatitis due to plants, except food: Secondary | ICD-10-CM

## 2018-06-27 DIAGNOSIS — I10 Essential (primary) hypertension: Secondary | ICD-10-CM

## 2018-06-27 DIAGNOSIS — R5383 Other fatigue: Secondary | ICD-10-CM

## 2018-06-27 LAB — POCT GLYCOSYLATED HEMOGLOBIN (HGB A1C): HBA1C, POC (PREDIABETIC RANGE): 5.8 % (ref 5.7–6.4)

## 2018-06-27 MED ORDER — VITAMIN D (ERGOCALCIFEROL) 1.25 MG (50000 UNIT) PO CAPS: 50000.0000 [IU] | ORAL_CAPSULE | ORAL | 10 refills | Status: DC
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
## 2018-06-27 NOTE — Patient Instructions (Addendum)
May use 1% hydrocortisone cream over-the-counter to slight rash bilateral ankles.  Please avoid exposure to high grass, excess heat etc.  Regarding the gnawing epigastric pain that she was seen GI for in the past and had an EGD a couple years ago, please try to identify what foods are lifestyle habits can be possibly causing it\making it worse since it is episodic.  Also since it has not gone away, I highly recommend you follow-up with your gastroenterologist to discuss possible need for further work-up/ investigation.  Please realize, EXERCISE IS MEDICINE!  -  American Heart Association [facility]) guidelines for exercise : If you are in good health, without any medical conditions, you should engage in 150 minutes of moderate intensity aerobic activity per week.  This means you should be huffing and puffing throughout your workout.   Engaging in regular exercise will improve brain function and memory, as well as improve mood, boost immune system and help with weight management.  As well as the other, more well-known effects of exercise such as decreasing blood sugar levels, decreasing blood pressure,  and decreasing bad cholesterol levels/ increasing good cholesterol levels.     -  The AHA strongly endorses consumption of a diet that contains a variety of foods from all the food categories with an emphasis on fruits and vegetables; fat-free and low-fat dairy products; cereal and grain products; legumes and nuts; and fish, poultry, and/or extra lean meats.    Excessive food intake, especially of foods high in saturated and trans fats, sugar, and salt, should be avoided.    Adequate water intake of roughly 1/2 of your weight in pounds, should equal the ounces of water per day you should drink.  So for instance, if you're 200 pounds, that would be 100 ounces of water per day.         Mediterranean Diet  Why follow it? Research shows. . Those who follow the Mediterranean diet have a reduced risk of heart  disease  . The diet is associated with a reduced incidence of Parkinson's and Alzheimer's diseases . People following the diet may have longer life expectancies and lower rates of chronic diseases  . The Dietary Guidelines for Americans recommends the Mediterranean diet as an eating plan to promote health and prevent disease  What Is the Mediterranean Diet?  . Healthy eating plan based on typical foods and recipes of Mediterranean-style cooking . The diet is primarily a plant based diet; these foods should make up a majority of meals   Starches - Plant based foods should make up a majority of meals - They are an important sources of vitamins, minerals, energy, antioxidants, and fiber - Choose whole grains, foods high in fiber and minimally processed items  - Typical grain sources include wheat, oats, barley, corn, brown rice, bulgar, farro, millet, polenta, couscous  - Various types of beans include chickpeas, lentils, fava beans, black beans, white beans   Fruits  Veggies - Large quantities of antioxidant rich fruits & veggies; 6 or more servings  - Vegetables can be eaten raw or lightly drizzled with oil and cooked  - Vegetables common to the traditional Mediterranean Diet include: artichokes, arugula, beets, broccoli, brussel sprouts, cabbage, carrots, celery, collard greens, cucumbers, eggplant, kale, leeks, lemons, lettuce, mushrooms, okra, onions, peas, peppers, potatoes, pumpkin, radishes, rutabaga, shallots, spinach, sweet potatoes, turnips, zucchini - Fruits common to the Mediterranean Diet include: apples, apricots, avocados, cherries, clementines, dates, figs, grapefruits, grapes, melons, nectarines, oranges, peaches, pears, pomegranates, strawberries,  tangerines  Fats - Replace butter and margarine with healthy oils, such as olive oil, canola oil, and tahini  - Limit nuts to no more than a handful a day  - Nuts include walnuts, almonds, pecans, pistachios, pine nuts  - Limit or avoid  candied, honey roasted or heavily salted nuts - Olives are central to the Mediterranean diet - can be eaten whole or used in a variety of dishes   Meats Protein - Limiting red meat: no more than a few times a month - When eating red meat: choose lean cuts and keep the portion to the size of deck of cards - Eggs: approx. 0 to 4 times a week  - Fish and lean poultry: at least 2 a week  - Healthy protein sources include, chicken, [place], lean beef, lamb - Increase intake of seafood such as tuna, salmon, trout, mackerel, shrimp, scallops - Avoid or limit high fat processed meats such as sausage and bacon  Dairy - Include moderate amounts of low fat dairy products  - Focus on healthy dairy such as fat free yogurt, skim milk, low or reduced fat cheese - Limit dairy products higher in fat such as whole or 2% milk, cheese, ice cream  Alcohol - Moderate amounts of red wine is ok  - No more than 5 oz daily for women (all ages) and men older than age 51  - No more than 10 oz of wine daily for men younger than 3  Other - Limit sweets and other desserts  - Use herbs and spices instead of salt to flavor foods  - Herbs and spices common to the traditional Mediterranean Diet include: basil, bay leaves, chives, cloves, cumin, fennel, garlic, lavender, marjoram, mint, oregano, parsley, pepper, rosemary, sage, savory, sumac, tarragon, thyme   It's not just a diet, it's a lifestyle:  . The Mediterranean diet includes lifestyle factors typical of those in the region  . Foods, drinks and meals are best eaten with others and savored . Daily physical activity is important for overall good health . This could be strenuous exercise like running and aerobics . This could also be more leisurely activities such as walking, housework, yard-work, or taking the stairs . Moderation is the key; a balanced and healthy diet accommodates most foods and drinks . Consider portion sizes and frequency of consumption of certain  foods   Meal Ideas & Options:  . Breakfast:  o Whole wheat toast or whole wheat English muffins with peanut butter & hard boiled egg o Steel cut oats topped with apples & cinnamon and skim milk  o Fresh fruit: banana, strawberries, melon, berries, peaches  o Smoothies: strawberries, bananas, greek yogurt, peanut butter o Low fat greek yogurt with blueberries and granola  o Egg white omelet with spinach and mushrooms o Breakfast couscous: whole wheat couscous, apricots, skim milk, cranberries  . Sandwiches:  o Hummus and grilled vegetables (peppers, zucchini, squash) on whole wheat bread   o Grilled chicken on whole wheat pita with lettuce, tomatoes, cucumbers or tzatziki  o Tuna salad on whole wheat bread: tuna salad made with greek yogurt, olives, red peppers, capers, green onions o Garlic rosemary lamb pita: lamb sauted with garlic, rosemary, salt & pepper; add lettuce, cucumber, greek yogurt to pita - flavor with lemon juice and black pepper  . Seafood:  o Mediterranean grilled salmon, seasoned with garlic, basil, parsley, lemon juice and black pepper o Shrimp, lemon, and spinach whole-grain pasta salad made with low fat  greek yogurt  o Seared scallops with lemon orzo  o Seared tuna steaks seasoned salt, pepper, coriander topped with tomato mixture of olives, tomatoes, olive oil, minced garlic, parsley, green onions and cappers  . Meats:  o Herbed greek chicken salad with kalamata olives, cucumber, feta  o Red bell peppers stuffed with spinach, bulgur, lean ground beef (or lentils) & topped with feta   o Kebabs: skewers of chicken, tomatoes, onions, zucchini, squash  o [place] burgers: made with red onions, mint, dill, lemon juice, feta cheese topped with roasted red peppers . Vegetarian o Cucumber salad: cucumbers, artichoke hearts, celery, red onion, feta cheese, tossed in olive oil & lemon juice  o Hummus and whole grain pita points with a greek salad (lettuce, tomato, feta, olives,  cucumbers, red onion) o Lentil soup with celery, carrots made with vegetable broth, garlic, salt and pepper  o Tabouli salad: parsley, bulgur, mint, scallions, cucumbers, tomato, radishes, lemon juice, olive oil, salt and pepper.    Risk factors for prediabetes and type 2 diabetes  Researchers don't fully understand why some people develop prediabetes and type 2 diabetes and others don't.  It's clear that certain factors increase the risk, however, including:  Weight. The more fatty tissue you have, the more resistant your cells become to insulin.  Inactivity. The less active you are, the greater your risk. Physical activity helps you control your weight, uses up glucose as energy and makes your cells more sensitive to insulin.  Family history. Your risk increases if a parent or sibling has type 2 diabetes.  Race. Although it's unclear why, people of certain races - including blacks, Hispanics, American Indians and Asian-Americans - are at higher risk.  Age. Your risk increases as you get older. This may be because you tend to exercise less, lose muscle mass and gain weight as you age. But type 2 diabetes is also increasing dramatically among children, adolescents and younger adults.  Gestational diabetes. If you developed gestational diabetes when you were pregnant, your risk of developing prediabetes and type 2 diabetes later increases. If you gave birth to a baby weighing more than 9 pounds (4 kilograms), you're also at risk of type 2 diabetes.  Polycystic ovary syndrome. For women, having polycystic ovary syndrome - a common condition characterized by irregular menstrual periods, excess hair growth and obesity - increases the risk of diabetes.  High blood pressure. Having blood pressure over 140/90 millimeters of mercury (mm Hg) is linked to an increased risk of type 2 diabetes.  Abnormal cholesterol and triglyceride levels. If you have low levels of high-density lipoprotein (HDL), or "good,"  cholesterol, your risk of type 2 diabetes is higher. Triglycerides are another type of fat carried in the blood. People with high levels of triglycerides have an increased risk of type 2 diabetes. Your doctor can let you know what your cholesterol and triglyceride levels are.  A good guide to good carbs: The glycemic index ---If you have diabetes, or at risk for diabetes, you know all too well that when you eat carbohydrates, your blood sugar goes up. The total amount of carbs you consume at a meal or in a snack mostly determines what your blood sugar will do. But the food itself also plays a role. A serving of white rice has almost the same effect as eating pure table sugar - a quick, high spike in blood sugar. A serving of lentils has a slower, smaller effect.  ---Picking good sources of carbs can help  you control your blood sugar and your weight. Even if you don't have diabetes, eating healthier carbohydrate-rich foods can help ward off a host of chronic conditions, from heart disease to various cancers to, well, diabetes.  ---One way to choose foods is with the glycemic index (GI). This tool measures how much a food boosts blood sugar.  The glycemic index rates the effect of a specific amount of a food on blood sugar compared with the same amount of pure glucose. A food with a glycemic index of 28 boosts blood sugar only 28% as much as pure glucose. One with a GI of 95 acts like pure glucose.    High glycemic foods result in a quick spike in insulin and blood sugar (also known as blood glucose).  Low glycemic foods have a slower, smaller effect- these are healthier for you.   Using the glycemic index Using the glycemic index is easy: choose foods in the low GI category instead of those in the high GI category (see below), and go easy on those in between. Low glycemic index (GI of 55 or less): Most fruits and vegetables, beans, minimally processed grains, pasta, low-fat dairy foods, and nuts.   Moderate glycemic index (GI 56 to 69): White and sweet potatoes, corn, white rice, couscous, breakfast cereals such as Cream of Wheat and Mini Wheats.  High glycemic index (GI of 70 or higher): White bread, rice cakes, most crackers, bagels, cakes, doughnuts, croissants, most packaged breakfast cereals. You can see the values for 100 commons foods and get links to more at [email or web address].[email or web address].hu.  Swaps for lowering glycemic index  Instead of this high-glycemic index food Eat this lower-glycemic index food  White rice Brown rice or converted rice  Instant oatmeal Steel-cut oats  Cornflakes Bran flakes  Baked potato Pasta, bulgur  White bread Whole-grain bread  Corn Peas or leafy greens       Prediabetes Eating Plan  Prediabetes--also called impaired glucose tolerance or impaired fasting glucose--is a condition that causes blood sugar (blood glucose) levels to be higher than normal. Following a healthy diet can help to keep prediabetes under control. It can also help to lower the risk of type 2 diabetes and heart disease, which are increased in people who have prediabetes. Along with regular exercise, a healthy diet:  Promotes weight loss.  Helps to control blood sugar levels.  Helps to improve the way that the body uses insulin.   WHAT DO I NEED TO KNOW ABOUT THIS EATING PLAN?   Use the glycemic index (GI) to plan your meals. The index tells you how quickly a food will raise your blood sugar. Choose low-GI foods. These foods take a longer time to raise blood sugar.  Pay close attention to the amount of carbohydrates in the food that you eat. Carbohydrates increase blood sugar levels.  Keep track of how many calories you take in. Eating the right amount of calories will help you to achieve a healthy weight. Losing about 7 percent of your starting weight can help to prevent type 2 diabetes.  You may want to follow a Mediterranean diet. This diet includes a lot of  vegetables, lean meats or fish, whole grains, fruits, and healthy oils and fats.   WHAT FOODS CAN I EAT?  Grains Whole grains, such as whole-wheat or whole-grain breads, crackers, cereals, and pasta. Unsweetened oatmeal. Bulgur. Barley. Quinoa. Brown rice. Corn or whole-wheat flour tortillas or taco shells. Vegetables Lettuce. Spinach. Peas. Beets. Cauliflower. Cabbage. Broccoli. Carrots. Tomatoes.  Squash. Eggplant. Herbs. Peppers. Onions. Cucumbers. Brussels sprouts. Fruits Berries. Bananas. Apples. Oranges. Grapes. Papaya. Mango. Pomegranate. Kiwi. Grapefruit. Cherries. Meats and Other Protein Sources Seafood. Lean meats, such as chicken and [place] or lean cuts of pork and beef. Tofu. Eggs. Nuts. Beans. Dairy Low-fat or fat-free dairy products, such as yogurt, cottage cheese, and cheese. Beverages Water. Tea. Coffee. Sugar-free or diet soda. Seltzer water. Milk. Milk alternatives, such as soy or almond milk. Condiments Mustard. Relish. Low-fat, low-sugar ketchup. Low-fat, low-sugar barbecue sauce. Low-fat or fat-free mayonnaise. Sweets and Desserts Sugar-free or low-fat pudding. Sugar-free or low-fat ice cream and other frozen treats. Fats and Oils Avocado. Walnuts. Olive oil. The items listed above may not be a complete list of recommended foods or beverages. Contact your dietitian for more options.    WHAT FOODS ARE NOT RECOMMENDED?  Grains Refined white flour and flour products, such as bread, pasta, snack foods, and cereals. Beverages Sweetened drinks, such as sweet iced tea and soda. Sweets and Desserts Baked goods, such as cake, cupcakes, pastries, cookies, and cheesecake. The items listed above may not be a complete list of foods and beverages to avoid. Contact your dietitian for more information.   This information is not intended to replace advice given to you by your health care provider. Make sure you discuss any questions you have with your health care provider.    Document Released: 03/26/2015 Document Reviewed: 03/26/2015 [employer] Interactive Patient Education [employer]

## 2018-07-04 ENCOUNTER — Other Ambulatory Visit: Payer: Self-pay | Admitting: Family Medicine

## 2018-07-14 ENCOUNTER — Other Ambulatory Visit: Payer: Self-pay | Admitting: Family Medicine

## 2018-07-14 DIAGNOSIS — I10 Essential (primary) hypertension: Secondary | ICD-10-CM

## 2018-07-29 ENCOUNTER — Other Ambulatory Visit: Payer: Self-pay | Admitting: Family Medicine

## 2018-08-09 ENCOUNTER — Other Ambulatory Visit: Payer: Self-pay | Admitting: Family Medicine

## 2018-08-09 DIAGNOSIS — I10 Essential (primary) hypertension: Secondary | ICD-10-CM

## 2018-09-17 ENCOUNTER — Other Ambulatory Visit: Payer: Self-pay | Admitting: Family Medicine

## 2018-10-09 ENCOUNTER — Other Ambulatory Visit: Payer: Self-pay | Admitting: Family Medicine

## 2018-10-09 DIAGNOSIS — E559 Vitamin D deficiency, unspecified: Secondary | ICD-10-CM

## 2018-10-31 ENCOUNTER — Encounter: Payer: Self-pay | Admitting: Family Medicine

## 2018-10-31 ENCOUNTER — Ambulatory Visit: Payer: BC Managed Care – PPO | Admitting: Family Medicine

## 2018-10-31 VITALS — BP 122/86 | HR 71 | Temp 97.6°F | Ht 63.0 in | Wt 228.2 lb

## 2018-10-31 DIAGNOSIS — R7303 Prediabetes: Secondary | ICD-10-CM

## 2018-10-31 DIAGNOSIS — N951 Menopausal and female climacteric states: Secondary | ICD-10-CM | POA: Diagnosis not present

## 2018-10-31 DIAGNOSIS — I10 Essential (primary) hypertension: Secondary | ICD-10-CM

## 2018-10-31 DIAGNOSIS — F411 Generalized anxiety disorder: Secondary | ICD-10-CM

## 2018-10-31 MED ORDER — AMLODIPINE-VALSARTAN-HCTZ 10-320-25 MG PO TABS: ORAL_TABLET | ORAL | 1 refills | Status: DC

## 2018-10-31 NOTE — Patient Instructions (Addendum)
It is important to wean down off of clonidine slowly.  Take a half-tablet of clonidine twice daily for five days. Then take a half-tablet of clonidine once daily for five days. Then, if you have to, take a half-tablet once every other day for five days. Then, if you have to, take a half-tablet once every 3-4 days.  Keep an eye on how the blood pressure adjusts. Remember that rebound hypertension is a big risk when waning off of clonidine.         Your goal blood pressure should be 135/85 or less on a regular basis, or medications should be started/ modified.    Normal blood pressure is less than 120/80.    Hypertension Hypertension, commonly called high blood pressure, is when the force of blood pumping through the arteries is too strong. The arteries are the blood vessels that carry blood from the heart throughout the body. Hypertension forces the heart to work harder to pump blood and may cause arteries to become narrow or stiff. Having untreated or uncontrolled hypertension can cause heart attacks, strokes, kidney disease, and other problems. A blood pressure reading consists of a higher number over a lower number. Ideally, your blood pressure should be below 120/80. The first ("top") number is called the systolic pressure. It is a measure of the pressure in your arteries as your heart beats. The second ("bottom") number is called the diastolic pressure. It is a measure of the pressure in your arteries as the heart relaxes. What are the causes? The cause of this condition is not known. What increases the risk? Some risk factors for high blood pressure are under your control. Others are not. Factors you can change  Smoking.  Having type 2 diabetes mellitus, high cholesterol, or both.  Not getting enough exercise or physical activity.  Being overweight.  Having too much fat, sugar, calories, or salt (sodium) in your diet.  Drinking too much alcohol. Factors that are  difficult or impossible to change  Having chronic kidney disease.  Having a family history of high blood pressure.  Age. Risk increases with age.  Race. You may be at higher risk if you are African-American.  Gender. Men are at higher risk than women before age 32. After age 35, women are at higher risk than men.  Having obstructive sleep apnea.  Stress. What are the signs or symptoms? Extremely high blood pressure (hypertensive crisis) may cause:  Headache.  Anxiety.  Shortness of breath.  Nosebleed.  Nausea and vomiting.  Severe chest pain.  Jerky movements you cannot control (seizures).  How is this diagnosed? This condition is diagnosed by measuring your blood pressure while you are seated, with your arm resting on a surface. The cuff of the blood pressure monitor will be placed directly against the skin of your upper arm at the level of your heart. It should be measured at least twice using the same arm. Certain conditions can cause a difference in blood pressure between your right and left arms. Certain factors can cause blood pressure readings to be lower or higher than normal (elevated) for a short period of time:  When your blood pressure is higher when you are in a health care provider's office than when you are at home, this is called white coat hypertension. Most people with this condition do not need medicines.  When your blood pressure is higher at home than when you are in a health care provider's office, this is called masked hypertension. Most  people with this condition may need medicines to control blood pressure.  If you have a high blood pressure reading during one visit or you have normal blood pressure with other risk factors:  You may be asked to return on a different day to have your blood pressure checked again.  You may be asked to monitor your blood pressure at home for 1 week or longer.  If you are diagnosed with hypertension, you may have  other blood or imaging tests to help your health care provider understand your overall risk for other conditions. How is this treated? This condition is treated by making healthy lifestyle changes, such as eating healthy foods, exercising more, and reducing your alcohol intake. Your health care provider may prescribe medicine if lifestyle changes are not enough to get your blood pressure under control, and if:  Your systolic blood pressure is above 130.  Your diastolic blood pressure is above 80.  Your personal target blood pressure may vary depending on your medical conditions, your age, and other factors. Follow these instructions at home: Eating and drinking  Eat a diet that is high in fiber and potassium, and low in sodium, added sugar, and fat. An example eating plan is called the DASH (Dietary Approaches to Stop Hypertension) diet. To eat this way: ? Eat plenty of fresh fruits and vegetables. Try to fill half of your plate at each meal with fruits and vegetables. ? Eat whole grains, such as whole wheat pasta, brown rice, or whole grain bread. Fill about one quarter of your plate with whole grains. ? Eat or drink low-fat dairy products, such as skim milk or low-fat yogurt. ? Avoid fatty cuts of meat, processed or cured meats, and poultry with skin. Fill about one quarter of your plate with lean proteins, such as fish, chicken without skin, beans, eggs, and tofu. ? Avoid premade and processed foods. These tend to be higher in sodium, added sugar, and fat.  Reduce your daily sodium intake. Most people with hypertension should eat less than 1,500 mg of sodium a day.  Limit alcohol intake to no more than 1 drink a day for nonpregnant women and 2 drinks a day for men. One drink equals 12 oz of beer, 5 oz of wine, or 1 oz of hard liquor. Lifestyle  Work with your health care provider to maintain a healthy body weight or to lose weight. Ask what an ideal weight is for you.  Get at least 30  minutes of exercise that causes your heart to beat faster (aerobic exercise) most days of the week. Activities may include walking, swimming, or biking.  Include exercise to strengthen your muscles (resistance exercise), such as pilates or lifting weights, as part of your weekly exercise routine. Try to do these types of exercises for 30 minutes at least 3 days a week.  Do not use any products that contain nicotine or tobacco, such as cigarettes and e-cigarettes. If you need help quitting, ask your health care provider.  Monitor your blood pressure at home as told by your health care provider.  Keep all follow-up visits as told by your health care provider. This is important. Medicines  Take over-the-counter and prescription medicines only as told by your health care provider. Follow directions carefully. Blood pressure medicines must be taken as prescribed.  Do not skip doses of blood pressure medicine. Doing this puts you at risk for problems and can make the medicine less effective.  Ask your health care provider about  side effects or reactions to medicines that you should watch for. Contact a health care provider if:  You think you are having a reaction to a medicine you are taking.  You have headaches that keep coming back (recurring).  You feel dizzy.  You have swelling in your ankles.  You have trouble with your vision. Get help right away if:  You develop a severe headache or confusion.  You have unusual weakness or numbness.  You feel faint.  You have severe pain in your chest or abdomen.  You vomit repeatedly.  You have trouble breathing. Summary  Hypertension is when the force of blood pumping through your arteries is too strong. If this condition is not controlled, it may put you at risk for serious complications.  Your personal target blood pressure may vary depending on your medical conditions, your age, and other factors. For most people, a normal blood  pressure is less than 120/80.  Hypertension is treated with lifestyle changes, medicines, or a combination of both. Lifestyle changes include weight loss, eating a healthy, low-sodium diet, exercising more, and limiting alcohol. This information is not intended to replace advice given to you by your health care provider. Make sure you discuss any questions you have with your health care provider. Document Released: 11/09/2005 Document Revised: 10/07/2016 Document Reviewed: 10/07/2016 Elsevier Interactive Patient Education  2018 [employer].    How to Take Your Blood Pressure   Blood pressure is a measurement of how strongly your blood is pressing against the walls of your arteries. Arteries are blood vessels that carry blood from your heart throughout your body. Your health care provider takes your blood pressure at each office visit. You can also take your own blood pressure at home with a blood pressure machine. You may need to take your own blood pressure:  To confirm a diagnosis of high blood pressure (hypertension).  To monitor your blood pressure over time.  To make sure your blood pressure medicine is working.  Supplies needed: To take your blood pressure, you will need a blood pressure machine. You can buy a blood pressure machine, or blood pressure monitor, at most drugstores or online. There are several types of home blood pressure monitors. When choosing one, consider the following:  Choose a monitor that has an arm cuff.  Choose a monitor that wraps snugly around your upper arm. You should be able to fit only one finger between your arm and the cuff.  Do not choose a monitor that measures your blood pressure from your wrist or finger.  Your health care provider can suggest a reliable monitor that will meet your needs. How to prepare To get the most accurate reading, avoid the following for 30 minutes before you check your blood pressure:  Drinking caffeine.  Drinking  alcohol.  Eating.  Smoking.  Exercising.  Five minutes before you check your blood pressure:  Empty your bladder.  Sit quietly without talking in a dining chair, rather than in a soft couch or armchair.  How to take your blood pressure To check your blood pressure, follow the instructions in the manual that came with your blood pressure monitor. If you have a digital blood pressure monitor, the instructions may be as follows: 1. Sit up straight. 2. Place your feet on the floor. Do not cross your ankles or legs. 3. Rest your left arm at the level of your heart on a table or desk or on the arm of a chair. 4. Pull  up your shirt sleeve. 5. Wrap the blood pressure cuff around the upper part of your left arm, 1 inch (2.5 cm) above your elbow. It is best to wrap the cuff around bare skin. 6. Fit the cuff snugly around your arm. You should be able to place only one finger between the cuff and your arm. 7. Position the cord inside the groove of your elbow. 8. Press the power button. 9. Sit quietly while the cuff inflates and deflates. 10. Read the digital reading on the monitor screen and write it down (record it). 11. Wait 2-3 minutes, then repeat the steps, starting at step 1.  What does my blood pressure reading mean? A blood pressure reading consists of a higher number over a lower number. Ideally, your blood pressure should be below 120/80. The first ("top") number is called the systolic pressure. It is a measure of the pressure in your arteries as your heart beats. The second ("bottom") number is called the diastolic pressure. It is a measure of the pressure in your arteries as the heart relaxes. Blood pressure is classified into four stages. The following are the stages for adults who do not have a short-term serious illness or a chronic condition. Systolic pressure and diastolic pressure are measured in a unit called mm Hg. Normal  Systolic pressure: below 120.  Diastolic pressure:  below 80. Elevated  Systolic pressure: 120-129.  Diastolic pressure: below 80. Hypertension stage 1  Systolic pressure: 130-139.  Diastolic pressure: 80-89. Hypertension stage 2  Systolic pressure: 140 or above.  Diastolic pressure: 90 or above. You can have prehypertension or hypertension even if only the systolic or only the diastolic number in your reading is higher than normal. Follow these instructions at home:  Check your blood pressure as often as recommended by your health care provider.  Take your monitor to the next appointment with your health care provider to make sure: ? That you are using it correctly. ? That it provides accurate readings.  Be sure you understand what your goal blood pressure numbers are.  Tell your health care provider if you are having any side effects from blood pressure medicine. Contact a health care provider if:  Your blood pressure is consistently high. Get help right away if:  Your systolic blood pressure is higher than 180.  Your diastolic blood pressure is higher than 110. This information is not intended to replace advice given to you by your health care provider. Make sure you discuss any questions you have with your health care provider. Document Released: 04/17/2016 Document Revised: 06/30/2016 Document Reviewed: 04/17/2016 Elsevier Interactive Patient Education  [employer].

## 2018-10-31 NOTE — Progress Notes (Signed)
Impression and Recommendations:    1. Essential hypertension   2. Menopausal hot flushes   3. Generalized anxiety disorder   4. Prediabetes   5. Poorly-controlled hypertension     1. Hypertension - Blood pressure controlled on treatment plan. - However per pt, feels that clonidine is causing fatigue. - Otherwise tolerating meds well without complication.  - Changes in treatment plan made today.  See med list below. - Clonidine will be weaned off and discontinued. - Amlodipine-Valsartan-HCTZ doubled today.  - Patient will begin weaning slowly down off of clonidine. - Discussed weaning down in detail with patient today.  Reviewed need for patient to take half-tablet of clonidine twice daily for 3-4 days, then half-tablet once daily for 3-4 days, and continue to wean as recommended.  - STRONGLY ENCOURAGED patient to check her blood pressure twice daily, and to keep an eye out for S-E as she weans off of clonidine.  - Patient knows the importance of checking her blood pressure during the weaning process to monitor for BP rebound.  Discussed at length that rebound hypertension is a real risk with weaning off of clonidine.  - Reviewed that we may need to add a beta blocker in the future if needed, to blunt the rebound BP.  - Lifestyle changes such as dash diet and engaging in a regular exercise program discussed with patient.  Educational handouts provided.  - Ambulatory BP monitoring encouraged. Keep log and bring in next OV.  2. Mood - Sx remain effectively managed by Celexa. - Continue treatment plan as prescribed.  See med list below. - Patient tolerating meds well without complication.  Denies S-E.  3. Hormone Replacement - Hot Flashes - Per pt, began weaning herself off of progesterone and estradiol recently. - Discussed importance of weaning slowly. - Patient knows to seek follow-up if needed to address potential sx.  - Reviewed that patient's hot flashes may  continue to worsen, especially as she weans off of clonidine in addition.  4. BMI Counseling - BMI of 40.4, Morbid Obesity Explained to patient what BMI refers to, and what it means medically.    Told patient to think about it as a "medical risk stratification measurement" and how increasing BMI is associated with increasing risk/ or worsening state of various diseases such as hypertension, hyperlipidemia, diabetes, premature OA, depression etc.  American Heart Association guidelines for healthy diet, basically Mediterranean diet, and exercise guidelines of 30 minutes 5 days per week or more discussed in detail.  Health counseling performed.  All questions answered.  5. Lifestyle & Preventative Health Maintenance - Advised patient to continue working toward exercising to improve overall mental, physical, and emotional health.    - Reviewed the "spokes of the wheel" of mood and health management.  Stressed the importance of ongoing prudent habits, including regular exercise, appropriate sleep hygiene, healthful dietary habits, and prayer/meditation to relax.  - Encouraged patient to engage in daily physical activity, especially a formal exercise routine.  Recommended that the patient eventually strive for at least 150 minutes of moderate cardiovascular activity per week according to guidelines established by the [facility].   - Healthy dietary habits encouraged, including low-carb, and high amounts of lean protein in diet.   - Patient should also consume adequate amounts of water.   Education and routine counseling performed. Handouts provided.   Meds ordered this encounter  Medications  . amLODIPine-Valsartan-HCTZ 10-320-25 MG TABS    Sig: 1 po qd    Dispense:  90 tablet    Refill:  1    The patient was counseled, risk factors were discussed, anticipatory guidance given.  Gross side effects, risk and benefits, and alternatives of medications discussed with patient.  Patient is aware that  all medications have potential side effects and we are unable to predict every side effect or drug-drug interaction that may occur.  Expresses verbal understanding and consents to current therapy plan and treatment regimen.  Return for 35mo f/up BP-ween off clonidine/double A-V-H, mood/GAD, hot flashes.  Please see AVS handed out to patient at the end of our visit for further patient instructions/ counseling done pertaining to today's office visit.    Note:  This document was prepared using Dragon voice recognition software and may include unintentional dictation errors.  This document serves as a record of services personally performed by Thomasene Loteborah Jadier Rockers, DO. It was created on her behalf by Peggye FothergillKatherine Galloway, a trained medical scribe. The creation of this record is based on the scribe's personal observations and the provider's statements to them.   I have reviewed the above medical documentation for accuracy and completeness and I concur.  Thomasene Loteborah Malika Demario, DO    Subjective:    HPI: Cathlyn ParsonsKaren Duddy is a 52 y.o. female who [facility] for follow up of CHRONIC CONDITIONS.    Notes her husband is still sick and has stated "I've never been this sick in my entire life."  Says they've had a toddler in the house "that's driven us crazy."  "What I want to talk about is the fatigue."  Feels that the clonidine gives her the "energy of a gnat" and her feelings of exhaustion definitely worsened after starting clonidine.  Says today that she just wants to sleep and nap all the time.  Keeps commenting "I could fall asleep right now."  Mood Patient states that she's been managed on Celexa "forever" and doing fine with it.  She tried to stop at one point in the past, but resumed.  Hormone Replacement Is weaning herself off of progesterone and estradiol.  Her hot flashes are back.  "Not back with a vengeance yet, but I thought I'm gonna give it a  shot."  Weight Loss & Activity Goals Goal was to power walk 30 minutes per day.  Patient has not accomplished her goal due to being busy and also due to her tiredness.  States "I stand all day on my feet."  Family History of Prediabetes Notes that her father recently was diagnosed with prediabetes.  HTN:  -  Her blood pressure has been controlled at home.  Pt has been checking it regularly.  Remarks "I've had a little pulse in my ear, but that could be nothing."  - Patient reports good compliance with blood pressure medications.  Feels fine managed on her current medications, aside from the clonidine.  - Denies medication S-E   - Smoking Status noted   - She denies new onset of: chest pain, exercise intolerance, shortness of breath, dizziness, visual changes, headache, lower extremity swelling or claudication.   Last 3 blood pressure readings in our office are as follows: BP Readings from Last 3 Encounters:  10/31/18 122/86  06/27/18 128/83  05/17/18 (!) 143/90    Pulse Readings from Last 3 Encounters:  10/31/18 71  06/27/18 72  05/17/18 78    Filed Weights   10/31/18 1019  Weight: 228 lb 3.2 oz (103.5 kg)      Patient  Care Team    Relationship Specialty Notifications Start End  Thomasene Lot, DO PCP - General Family Medicine  08/10/17   Stanton Kidney, MD Consulting Physician Gastroenterology  09/06/17   Lenn Sink, DPM Consulting Physician Podiatry  09/06/17   Head, Irineo Axon, PT  Physical Therapy  09/06/17      Lab Results  Component Value Date   CREATININE 0.73 06/20/2018   BUN 14 06/20/2018   NA 142 06/20/2018   K 4.5 06/20/2018   CL 105 06/20/2018   CO2 24 06/20/2018    Lab Results  Component Value Date   CHOL 203 (H) 06/20/2018    Lab Results  Component Value Date   HDL 67 06/20/2018    Lab Results  Component Value Date   LDLCALC 116 (H) 06/20/2018    Lab Results  Component Value Date   TRIG 101 06/20/2018    Lab  Results  Component Value Date   CHOLHDL 3.0 06/20/2018    No results found for: LDLDIRECT ===================================================================   Patient Active Problem List   Diagnosis Date Noted  . Essential hypertension 01/04/2016    Priority: High  . Obesity (BMI 35.0-39.9 without comorbidity) 01/13/2017    Priority: Medium  . Generalized anxiety disorder 09/17/2010    Priority: Medium  . S/P hysterectomy 09/06/2017    Priority: Low  . Vitamin D deficiency 10/15/2010    Priority: Low  . Menopausal hot flushes 10/31/2018  . Deep tissue injury-  keloid formation/ inc fibroblastic activtity  06/27/2018  . Keloid of skin 06/27/2018  . Contact dermatitis and eczema due to plant 06/27/2018  . Epigastric pain- chronic :  gnawing 06/27/2018  . Fatigue 05/17/2018  . New daily persistent headache-  likely secondary to acute increase in blood pressure 05/17/2018  . Acute maxillary sinusitis 12/08/2017  . Cough in adult 12/08/2017  . Hx of candidiasis 12/08/2017  . Prediabetes 09/06/2017  . Plantar fascial fibromatosis of left foot 09/06/2017  . IFG (impaired fasting glucose) 01/13/2017  . Vasomotor instability 01/04/2016  . Right upper quadrant abdominal pain 12/24/2015     Past Medical History:  Diagnosis Date  . Depression   . Hypertension      Past Surgical History:  Procedure Laterality Date  . ABDOMINAL HYSTERECTOMY  2002  . BREAST BIOPSY  1981  . TONSILLECTOMY       Family History  Problem Relation Age of Onset  . Diabetes Father        pre  . Hypertension Father      Social History   Substance and Sexual Activity  Drug Use No  ,  Social History   Substance and Sexual Activity  Alcohol Use Yes  . Alcohol/week: 1.0 - 2.0 standard drinks  . Types: 1 - 2 Glasses of wine per week  ,  Social History   Tobacco Use  Smoking Status Never Smoker  Smokeless Tobacco Never Used  ,    Current Outpatient Medications on File Prior to  Visit  Medication Sig Dispense Refill  . amLODIPine-Valsartan-HCTZ 5-160-12.5 MG TABS Take 1 tablet by mouth daily. PLEASE SEE ATTACHED FOR DETAILED DIRECTIONS 90 tablet 1  . Barberry-Oreg Grape-Goldenseal (BERBERINE COMPLEX PO) Take 2 tablets by mouth daily.    . Cholecalciferol (VITAMIN D3) 5000 units CAPS Take 1 capsule by mouth daily.    . citalopram (CELEXA) 20 MG tablet TAKE 1 TABLET BY MOUTH EVERY DAY 90 tablet 0  . cloNIDine (CATAPRES) 0.1 MG tablet Take 0.05 mg  by mouth 2 (two) times daily. Take 1/2 tab BID- then ween as tolerated    . Coenzyme Q10 (COQ10) 100 MG CAPS Take 1 capsule by mouth daily.    . Multiple Vitamin (MULTIVITAMIN) capsule Take 1 capsule by mouth daily.    . Probiotic Product (PROBIOTIC DAILY PO) Take 1 tablet by mouth daily.    . Turmeric 500 MG CAPS Take 2 capsules by mouth daily.    . Vitamin D, Ergocalciferol, (DRISDOL) 1.25 MG (50000 UT) CAPS capsule TAKE 1 CAPSULE (50,000 UNITS TOTAL) BY MOUTH EVERY 7 (SEVEN) DAYS. 12 capsule 10  . progesterone (PROMETRIUM) 100 MG capsule      Current Facility-Administered Medications on File Prior to Visit  Medication Dose Route Frequency Provider Last Rate Last Dose  . triamcinolone acetonide (KENALOG) 10 MG/ML injection 10 mg  10 mg Other Once Lenn Sink, DPM         Allergies  Allergen Reactions  . Diclofenac Diarrhea    Abdominal cramping.     Review of Systems:   General:  Denies fever, chills Optho/Auditory:   Denies visual changes, blurred vision Respiratory:   Denies SOB, cough, wheeze, DIB  Cardiovascular:   Denies chest pain, palpitations, painful respirations Gastrointestinal:   Denies nausea, vomiting, diarrhea.  Endocrine:     Denies new hot or cold intolerance Musculoskeletal:  Denies joint swelling, gait issues, or new unexplained myalgias/ arthralgias Skin:  Denies rash, suspicious lesions  Neurological:    Denies dizziness, unexplained weakness, numbness  Psychiatric/Behavioral:   Denies  mood changes  Objective:    Blood pressure 122/86, pulse 71, temperature 97.6 F (36.4 C), height 5\' 3"  (1.6 m), weight 228 lb 3.2 oz (103.5 kg), SpO2 99 %.  Body mass index is 40.42 kg/m.  General: Well Developed, well nourished, and in no acute distress.  HEENT: Normocephalic, atraumatic, pupils equal round reactive to light, neck supple, No carotid bruits, no JVD Skin: Warm and dry, cap RF less 2 sec Cardiac: Regular rate and rhythm, S1, S2 WNL's, no murmurs rubs or gallops Respiratory: ECTA B/L, Not using accessory muscles, speaking in full sentences. NeuroM-Sk: Ambulates w/o assistance, moves ext * 4 w/o difficulty, sensation grossly intact.  Ext: scant edema b/l lower ext Psych: No HI/SI, judgement and insight good, Euthymic mood. Full Affect.

## 2018-11-14 ENCOUNTER — Other Ambulatory Visit: Payer: Self-pay | Admitting: Orthopedic Surgery

## 2018-11-14 DIAGNOSIS — M25512 Pain in left shoulder: Secondary | ICD-10-CM

## 2018-11-23 HISTORY — PX: ROTATOR CUFF REPAIR: SHX139

## 2018-11-24 ENCOUNTER — Other Ambulatory Visit: Payer: Self-pay | Admitting: Orthopedic Surgery

## 2018-11-24 ENCOUNTER — Ambulatory Visit
Admission: RE | Admit: 2018-11-24 | Discharge: 2018-11-24 | Disposition: A | Discharge status: Home / Self Care | Payer: BC Managed Care – PPO | Source: Ambulatory Visit | Attending: Orthopedic Surgery | Admitting: Orthopedic Surgery

## 2018-11-24 DIAGNOSIS — M25512 Pain in left shoulder: Secondary | ICD-10-CM

## 2018-11-25 ENCOUNTER — Inpatient Hospital Stay: Admission: RE | Admit: 2018-11-25 | Payer: BC Managed Care – PPO | Source: Ambulatory Visit

## 2018-11-25 ENCOUNTER — Other Ambulatory Visit: Payer: BC Managed Care – PPO

## 2018-11-26 ENCOUNTER — Ambulatory Visit
Admission: RE | Admit: 2018-11-26 | Discharge: 2018-11-26 | Disposition: A | Discharge status: Home / Self Care | Payer: BC Managed Care – PPO | Source: Ambulatory Visit | Attending: Orthopedic Surgery | Admitting: Orthopedic Surgery

## 2018-11-26 DIAGNOSIS — M25512 Pain in left shoulder: Secondary | ICD-10-CM

## 2019-02-25 ENCOUNTER — Other Ambulatory Visit: Payer: Self-pay | Admitting: Family Medicine

## 2019-03-02 ENCOUNTER — Other Ambulatory Visit: Payer: Self-pay

## 2019-03-02 ENCOUNTER — Encounter: Payer: Self-pay | Admitting: Family Medicine

## 2019-03-02 ENCOUNTER — Ambulatory Visit (INDEPENDENT_AMBULATORY_CARE_PROVIDER_SITE_OTHER): Payer: BC Managed Care – PPO | Admitting: Family Medicine

## 2019-03-02 VITALS — Temp 97.0°F | Ht 63.0 in | Wt 228.0 lb

## 2019-03-02 DIAGNOSIS — Z0000 Encounter for general adult medical examination without abnormal findings: Secondary | ICD-10-CM

## 2019-03-02 DIAGNOSIS — E669 Obesity, unspecified: Secondary | ICD-10-CM

## 2019-03-02 DIAGNOSIS — E559 Vitamin D deficiency, unspecified: Secondary | ICD-10-CM

## 2019-03-02 DIAGNOSIS — R7303 Prediabetes: Secondary | ICD-10-CM

## 2019-03-02 DIAGNOSIS — I10 Essential (primary) hypertension: Secondary | ICD-10-CM | POA: Diagnosis not present

## 2019-03-02 DIAGNOSIS — F411 Generalized anxiety disorder: Secondary | ICD-10-CM

## 2019-03-02 MED ORDER — AMLODIPINE-VALSARTAN-HCTZ 5-160-12.5 MG PO TABS: 1.0000 | ORAL_TABLET | Freq: Every day | ORAL | 1 refills | Status: DC

## 2019-03-02 MED ORDER — CITALOPRAM HYDROBROMIDE 20 MG PO TABS: 20.0000 mg | ORAL_TABLET | Freq: Every day | ORAL | 1 refills | Status: DC

## 2019-03-02 NOTE — Progress Notes (Signed)
Virtual Visit via Telephone Note for [name], [name]- Primary [facility] Physician [facility]   I connected with current patient today by telephone and verified that I am speaking with the correct person using two identifiers.   Because of federal recommendations of social distancing due to the current novel COVID-19 outbreak, an audio/video telehealth visit is felt to be most appropriate for this patient at this time.  My staff members also discussed with the patient that there may be a patient charge related to this service.   The patient expressed understanding, and agreed to proceed.     History of Present Illness:    1. Had rot cuff sx by Dr Thurston HoleWainer- in Feb-  L shoulder- r handed.  She has done well and states she is no longer doing physical therapy or anything.  It was a partial tear repair    2.    HTN HPI:  ---->  last OV  - inc Val-HCTZ-Amlo dose. Doing well.  No issues  -  Her blood pressure has not been controlled at home.  Pt isn't checking it at home.   - Patient reports good compliance with blood pressure medications  - Denies medication S-E   - Smoking Status noted   - She denies new onset of: chest pain, exercise intolerance, shortness of breath, dizziness, visual changes, headache, lower extremity swelling or claudication.   Today their BP is     Last 3 blood pressure readings in our office are as fonollows: BP Readings from Last 3 Encounters:  10/31/18 122/86  06/27/18 128/83  05/17/18 (!) 143/90      3.      Mood:   On celexa 20mg  - no anxiety per se.  Patient has been doing well.  She has no complaints today.    4.      WT-  Hasn't inc nor has it decreased.  Patient is not been working on diet or exercise during these crazy times of cove id [id number].        Wt Readings from Last 3 Encounters:  03/02/19 228 lb (103.4 kg)  10/31/18 228 lb 3.2 oz (103.5 kg)  06/27/18 227 lb (103 kg)    BP Readings from Last 3 Encounters:  10/31/18  122/86  06/27/18 128/83  05/17/18 (!) 143/90    Pulse Readings from Last 3 Encounters:  10/31/18 71  06/27/18 72  05/17/18 78    BMI Readings from Last 3 Encounters:  03/02/19 40.39 kg/m  10/31/18 40.42 kg/m  06/27/18 40.21 kg/m    -Vitals obtained; Medications, allergies reconciled;  personal medical, social, Sx etc. etc. histories were updated by Leda MinMelissa Pulliam the medical assistant today and are reflected in below chart   Patient Care Team    Relationship Specialty Notifications Start End  Brianna Odom, Danh Bayus, DO PCP - General Family Medicine  08/10/17   Stanton Kidneyoledo, Teodoro K, MD Consulting Physician Gastroenterology  09/06/17   Lenn Sinkegal, Norman S, DPM Consulting Physician Podiatry  09/06/17   Head, Irineo AxonStacey M, PT  Physical Therapy  09/06/17      Patient Active Problem List   Diagnosis Date Noted  . Essential hypertension 01/04/2016    Priority: High  . Obesity (BMI 35.0-39.9 without comorbidity) 01/13/2017    Priority: Medium  . Generalized anxiety disorder 09/17/2010    Priority: Medium  . S/P hysterectomy 09/06/2017    Priority: Low  . Vitamin D deficiency 10/15/2010    Priority: Low  .  Menopausal hot flushes 10/31/2018  . Deep tissue injury-  keloid formation/ inc fibroblastic activtity  06/27/2018  . Keloid of skin 06/27/2018  . Contact dermatitis and eczema due to plant 06/27/2018  . Epigastric pain- chronic :  gnawing 06/27/2018  . Fatigue 05/17/2018  . New daily persistent headache-  likely secondary to acute increase in blood pressure 05/17/2018  . Acute maxillary sinusitis 12/08/2017  . Cough in adult 12/08/2017  . Hx of candidiasis 12/08/2017  . Prediabetes 09/06/2017  . Plantar fascial fibromatosis of left foot 09/06/2017  . IFG (impaired fasting glucose) 01/13/2017  . Vasomotor instability 01/04/2016  . Right upper quadrant abdominal pain 12/24/2015     Current Meds  Medication Sig  . amLODIPine-Valsartan-HCTZ 5-160-12.5 MG TABS Take 1 tablet by  mouth daily. PLEASE SEE ATTACHED FOR DETAILED DIRECTIONS  . Barberry-Oreg Grape-Goldenseal (BERBERINE COMPLEX PO) Take 2 tablets by mouth daily.  . Cholecalciferol (VITAMIN D3) 5000 units CAPS Take 1 capsule by mouth daily.  . citalopram (CELEXA) 20 MG tablet Take 1 tablet (20 mg total) by mouth daily.  . Coenzyme Q10 (COQ10) 100 MG CAPS Take 1 capsule by mouth daily.  . Multiple Vitamin (MULTIVITAMIN) capsule Take 1 capsule by mouth daily.  . Probiotic Product (PROBIOTIC DAILY PO) Take 1 tablet by mouth daily. As needed  . Turmeric 500 MG CAPS Take 2 capsules by mouth daily.  . Vitamin D, Ergocalciferol, (DRISDOL) 1.25 MG (50000 UT) CAPS capsule TAKE 1 CAPSULE (50,000 UNITS TOTAL) BY MOUTH EVERY 7 (SEVEN) DAYS.  . [DISCONTINUED] amLODIPine-Valsartan-HCTZ 5-160-12.5 MG TABS Take 1 tablet by mouth daily. PLEASE SEE ATTACHED FOR DETAILED DIRECTIONS  . [DISCONTINUED] citalopram (CELEXA) 20 MG tablet TAKE 1 TABLET BY MOUTH EVERY DAY   Current Facility-Administered Medications for the 03/02/19 encounter (Office Visit) with Brianna Lot, DO  Medication  . triamcinolone acetonide (KENALOG) 10 MG/ML injection 10 mg     Allergies:  Allergies  Allergen Reactions  . Diclofenac Diarrhea    Abdominal cramping.     ROS:  See above HPI for pertinent positives and negatives   Objective:   Temperature (!) 97 F (36.1 C), height 5\' 3"  (1.6 m), weight 228 lb (103.4 kg). (if some vitals are omitted, this means that patient was UNABLE to obtain them even though asked to get them prior to OV today) General: sounds in no acute distress.  Skin: Pt confirms warm and dry  extremities and pink fingertips Respiratory: speaking in full sentences, no conversational dyspnea Psych: A and O *3, appears insight good, mood- full      Impression and Recommendations:      ICD-10-CM   1. Essential hypertension I10 amLODIPine-Valsartan-HCTZ 5-160-12.5 MG TABS  2. Generalized anxiety disorder F41.1 citalopram  (CELEXA) 20 MG tablet  3. Prediabetes R73.03   4. Obesity (BMI 35.0-39.9 without comorbidity) E66.9   5. Vitamin D deficiency E55.9     -  Told patient she needs to check her blood pressure at home.  Advise she order blood pressure monitor online.  Advised upper arm 1.  Told patient she should check it every Monday Wednesday and Friday at a minimum.  Told her to go Tumeric and heart Association website regarding specifics about how to check blood pressure.  Also would like her to read about hypertension and ways she could better control it on her own through DASH diet, exercise, weight loss etc. which we briefly touched upon today as well.  -Anxiety has been well controlled.   - Novel Covid -  19 counseling done; all questions were answered.   - Current CDC and federal guidelines reviewed with patient  - Reminded pt of extreme importance of social distancing; minimizing contacts with others, avoiding ALL but emergency appts etc. - Told patient to be prepared, not scared; and be smart for the sake of others - told to call with any concerns   -Reminded patient of her prediabetes and 2 years ago A1c was 5.8 and approximately 9 months ago it was 5.8 again.  Told patient this was last checked 06/27/2018 and I recommended she get a full set of blood work as she would be overdue at that time as well. -Explained to patient what BMI refers to, and what it means medically.    Told patient to think about it as a "medical risk stratification measurement" and how increasing BMI is associated with increasing risk/ or worsening state of various diseases such as hypertension, hyperlipidemia, diabetes, premature OA, depression etc. -American Heart Association guidelines for healthy diet, basically Mediterranean diet, and exercise guidelines of 30 minutes 5 days per week or more discussed in detail. -Health counseling performed.  All questions answered.   As part of my medical decision making, I reviewed the  following data within the electronic MEDICAL RECORD NUMBER History obtained from pt/family, CMA notes reviewed and incorporated, Labs reviewed, Radiograph/ tests reviewed if applicable and OV notes from prior OV's with me, as well as other specialists he has seen since seeing me last, were all reviewed and used in my medical decision making process today. Additionally, discussion had with patient regarding txmnt plan, their biases about that plan etc were used in my medical decision making today.  I discussed the assessment and treatment plan with the patient. The patient was provided an opportunity to ask questions and all were answered.  The patient agreed with the plan and demonstrated an understanding of the instructions.   No barriers to understanding were identified.  Red flag symptoms and signs discussed in detail.  Patient expressed understanding regarding what to do in case of emergency\urgent symptoms   The patient was advised to call back or seek an in-person evaluation if the symptoms worsen or if the condition fails to improve as anticipated.   Return for aug 1st or so CPE w/  FBW same day. .    Meds ordered this encounter  Medications  . amLODIPine-Valsartan-HCTZ 5-160-12.5 MG TABS    Sig: Take 1 tablet by mouth daily. PLEASE SEE ATTACHED FOR DETAILED DIRECTIONS    Dispense:  90 tablet    Refill:  1  . citalopram (CELEXA) 20 MG tablet    Sig: Take 1 tablet (20 mg total) by mouth daily.    Dispense:  90 tablet    Refill:  1    Medications Discontinued During This Encounter  Medication Reason  . amLODIPine-Valsartan-HCTZ 10-320-25 MG TABS Change in therapy  . cloNIDine (CATAPRES) 0.1 MG tablet Completed Course  . progesterone (PROMETRIUM) 100 MG capsule Completed Course  . amLODIPine-Valsartan-HCTZ 5-160-12.5 MG TABS Reorder  . citalopram (CELEXA) 20 MG tablet Reorder      **Gross side effects, risk and benefits, and alternatives of medications and treatment plan in general  discussed with patient.  Patient is aware that all medications have potential side effects and we are unable to predict every side effect or drug-drug interaction that may occur.   Patient was strongly encouraged to call with any questions or concerns they may have concerns.     I provided  18 minutes of non-face-to-face time during this encounter.   Brianna Lot, DO

## 2019-08-22 ENCOUNTER — Telehealth: Payer: Self-pay

## 2019-08-22 ENCOUNTER — Other Ambulatory Visit: Payer: Self-pay | Admitting: Family Medicine

## 2019-08-22 DIAGNOSIS — I10 Essential (primary) hypertension: Secondary | ICD-10-CM

## 2019-08-22 NOTE — Telephone Encounter (Signed)
Please call pt to schedule CPE.  She was supposed to have this done 06/2019.  If no availability for CPE in the next 30 days, please schedule a follow up and a CPE for pt.  Charyl Bigger, CMA

## 2019-08-31 ENCOUNTER — Telehealth: Payer: Self-pay | Admitting: Family Medicine

## 2019-08-31 NOTE — Telephone Encounter (Signed)
Called pt to set her up for provider required APPT--left message for her to call office   Please call pt to schedule CPE.  She was supposed to have this done 06/2019.  If no availability for CPE in the next 30 days, please schedule a follow up and a CPE for pt.  Charyl Bigger, CMA         --glh

## 2019-09-19 ENCOUNTER — Other Ambulatory Visit: Payer: Self-pay | Admitting: Family Medicine

## 2019-09-19 ENCOUNTER — Telehealth: Payer: Self-pay | Admitting: Family Medicine

## 2019-09-19 DIAGNOSIS — I10 Essential (primary) hypertension: Secondary | ICD-10-CM

## 2019-09-19 NOTE — Telephone Encounter (Signed)
Patient needs apt for any further refills.

## 2019-09-19 NOTE — Telephone Encounter (Signed)
Can you please contact patient to schedule apt for follow up for additional refills. Thank you, AS, CMA

## 2019-10-04 ENCOUNTER — Other Ambulatory Visit: Payer: Self-pay | Admitting: Family Medicine

## 2019-10-04 DIAGNOSIS — I10 Essential (primary) hypertension: Secondary | ICD-10-CM

## 2019-10-12 ENCOUNTER — Telehealth: Payer: Self-pay | Admitting: Family Medicine

## 2019-10-12 NOTE — Telephone Encounter (Signed)
Left pt 2nd message to call office to set up Appt needed for Rx refill--  --FYI to med asst.

## 2019-10-16 ENCOUNTER — Ambulatory Visit (INDEPENDENT_AMBULATORY_CARE_PROVIDER_SITE_OTHER): Payer: BC Managed Care – PPO | Admitting: Family Medicine

## 2019-10-16 ENCOUNTER — Other Ambulatory Visit: Payer: Self-pay

## 2019-10-16 ENCOUNTER — Encounter: Payer: Self-pay | Admitting: Family Medicine

## 2019-10-16 VITALS — BP 126/82 | Ht 63.0 in | Wt 228.0 lb

## 2019-10-16 DIAGNOSIS — F411 Generalized anxiety disorder: Secondary | ICD-10-CM

## 2019-10-16 DIAGNOSIS — R7303 Prediabetes: Secondary | ICD-10-CM | POA: Diagnosis not present

## 2019-10-16 DIAGNOSIS — E559 Vitamin D deficiency, unspecified: Secondary | ICD-10-CM | POA: Diagnosis not present

## 2019-10-16 DIAGNOSIS — E7841 Elevated Lipoprotein(a): Secondary | ICD-10-CM

## 2019-10-16 DIAGNOSIS — I10 Essential (primary) hypertension: Secondary | ICD-10-CM | POA: Diagnosis not present

## 2019-10-16 DIAGNOSIS — E669 Obesity, unspecified: Secondary | ICD-10-CM

## 2019-10-16 MED ORDER — AMLODIPINE-VALSARTAN-HCTZ 5-160-12.5 MG PO TABS: ORAL_TABLET | ORAL | 0 refills | Status: DC

## 2019-10-16 MED ORDER — CITALOPRAM HYDROBROMIDE 20 MG PO TABS: 20.0000 mg | ORAL_TABLET | Freq: Every day | ORAL | 0 refills | Status: DC

## 2019-10-16 MED ORDER — VITAMIN D (ERGOCALCIFEROL) 1.25 MG (50000 UNIT) PO CAPS: 50000.0000 [IU] | ORAL_CAPSULE | ORAL | 0 refills | Status: DC

## 2019-10-16 NOTE — Progress Notes (Signed)
Telehealth office visit note for Brianna Odom, [name]- at Primary Care at [facility]   I connected with current patient today and verified that I am speaking with the correct person using two identifiers.   . Location of the patient: Home . Location of the provider: Office Only the patient (+/- their family members at pt's discretion) and myself were participating in the encounter - This visit type was conducted due to national recommendations for restrictions regarding the COVID-19 Pandemic (e.g. social distancing) in an effort to limit this patient's exposure and mitigate transmission in our community.  This format is felt to be most appropriate for this patient at this time.   - The patient did not have access to video technology or had technical difficulties with video requiring transitioning to audio format only. - No physical exam could be performed with this format, beyond that communicated to [place] by the patient/ family members as noted.   - Additionally my office staff/ schedulers discussed with the patient that there may be a monetary charge related to this service, depending on their medical insurance.   The patient expressed understanding, and agreed to proceed.       History of Present Illness:  I, Brianna Odom, am serving as scribe for Dr. Thomasene Odom.  Notes she's been more stressed lately because she's had to go back to work; comments "I'm an old dog learning new tricks" [remote, on the computer].  Says her husband continues to work as well and "we're all kind of just stressed."  "I'll be perfectly honest, I've been struggling a little bit."  "It's not been a real good time the last 2-3 weeks; I've had a Odom throw at me at once."  Regarding work, says "You gotta do what you gotta do and old people don't like change!"  Says she hasn't been to a restaurant in a year due to COVID.  - Anxiety On a scale of 1-10, patient ranks her anxiety around a six or a seven.   She continues on her Celexa as prescribed.  Says "I can handle the anxiety; I don't want blood pressure going up because of it; I don't want the heavy duty stuff to hurt because of it."  Says "it's just a little stress, like everybody goes through.  I just gotta get used to doing something new, and once I do that, Bekki will be ok."  Notes she goes for a good 30 minute walk most days.  Says "I pray; we all pray.  I'm good; I think I'm good."  Feels her main stress is due to "jumping into her job," and notes "once I get going, I think I'll be okay.  I have always had to do it before I understand it," as in, she feels she will be able to manage her stress better after she adjusts to changes.  - Sleep Says "because everybody's schedule is completely off, and I haven't worked since March, I putter around at midnight and it takes me a while to get to sleep."  Notes usually sleeps from midnight to 8 AM.  She had surgery in January for her shoulder and slept for 3 months in a chair.  States she has not been on a schedule for probably a year.  Says she usually gets in bed by 9:30 or 10 if she's been on her feet all day.  - Hormonal Concerns Patient discontinued integrated health in [place] and states she wishes to  resume estradiol.  She agrees to ask her OBGYN regarding her concerns.  HPI:  Hypertension:  Says in the morning her blood pressure is higher, with her highest reading being 134/98.  Notes her lower number seems to be elevated most mornings around 90, and at night it's lower, "back around normal."  In the evening, her BP runs 119/83, 123/80.  Says that on average it's more normal.  Says she doesn't think her cuff is entirely accurate.  Notes "it's been completely different when you take it."  - Patient reports good compliance with medication and/or lifestyle modification.  She takes her BP meds in the morning.  - Her denies acute concerns or problems related to treatment plan  -  She denies new onset of: chest pain, exercise intolerance, shortness of breath, dizziness, visual changes, headache, lower extremity swelling or claudication.   Last 3 blood pressure readings in our office are as follows: BP Readings from Last 3 Encounters:  10/16/19 126/82  10/31/18 122/86  06/27/18 128/83   Filed Weights   10/16/19 0953  Weight: 228 lb (103.4 kg)      No flowsheet data found.  Depression screen [facility] 2/9 10/16/2019 03/02/2019 10/31/2018 06/27/2018 05/17/2018  Decreased Interest 0 0 0 0 0  Down, Depressed, Hopeless 0 0 0 0 0  PHQ - 2 Score 0 0 0 0 0  Altered sleeping 0 0 0 0 0  Tired, decreased energy 0 0 0 0 2  Change in appetite 0 0 0 0 0  Feeling bad or failure about yourself  0 0 0 0 0  Trouble concentrating 0 0 0 0 0  Moving slowly or fidgety/restless 0 0 0 0 0  Suicidal thoughts 0 0 0 0 0  PHQ-9 Score 0 0 0 0 2  Difficult doing work/chores Not difficult at all Not difficult at all Not difficult at all Not difficult at all Somewhat difficult      Impression and Recommendations:    1. Essential hypertension   2. Generalized anxiety disorder   3. Prediabetes   4. Vitamin D deficiency   5. Obesity (BMI 35.0-39.9 without comorbidity)   6. Elevated lipoprotein(a)      Chronic Care & Health Maintenance - Last seen for follow-up in April 2020. - At that time, discussed need for patient to return every 6 months for chronic care.  - As patient has not had blood work since 7 of 2019, one refill of medications will be provided, and then patient MUST follow up for CPE and fasting blood work in next 90 days.  - Discussed critical need for up to date blood work/screening during appointment today.  Generalized Anxiety Disorder - Managed on Celexa. - Per patient, overall stable on treatment plan.  See med list.  - Discussed acute stress with patient today. - To help with recent acute adjustment stress, reviewed the "spokes of the wheel" of mood and health  management.  Stressed the importance of ongoing prudent habits, including regular exercise, appropriate sleep hygiene, healthful dietary habits, and prayer/meditation (including apps) to relax.  - If patient desires assistance with sleep/wake cycles, advised prudent use of melatonin PRN.  - Patient will call in with concerns about treatment plan.  - Will continue to monitor.  Essential Hypertension - Patient obtained BP cuff since last OV. - Reviewed goal of BP average in the 120's/80's.  - As patient's BP is running slightly elevated in the morning, discussed option of adjusting treatment plan to assist with  control of early morning and nighttime BP's.  - Patient declines change in treatment plan today.   - States she wishes to monitor her BP until next follow-up and make changes PRN.  - Reviewed that prudent meditation can be used to improve BP.  - Ambulatory blood pressure monitoring encouraged at least 3 times weekly.  Keep log and bring in every office visit.  Reminded patient that if they ever feel poorly in any way, to check their blood pressure and pulse.  - Counseled patient on pathophysiology of disease and discussed various treatment options, which always includes dietary and lifestyle modification as first line.   - Lifestyle changes such as dash and heart healthy diets and engaging in a regular exercise program discussed extensively with patient.   - Handouts provided at patient's desire and/or told to go online at the [place] website for further information  - We will continue to monitor  Elevated Lipoprotein - Lipid panel last checked 7 of 2019.  - HDL = 67, WNL. - Triglycerides = 101, WNL. - LDL = 116, elevated.  - Need for re-check near future. - Will continue to monitor.  Hormonal Concerns - Per patient wishing to resume management on Estradiol. - Education provided to patient today.  All questions answered. - Encouraged patient to return  to her OBGYN for hormonal concerns.  BMI Counseling - Body mass index is 40.39 kg/m Explained to patient what BMI refers to, and what it means medically.    Told patient to think about it as a "medical risk stratification measurement" and how increasing BMI is associated with increasing risk/ or worsening state of various diseases such as hypertension, hyperlipidemia, diabetes, premature OA, depression etc.  American Heart Association guidelines for healthy diet, basically Mediterranean diet, and exercise guidelines of 30 minutes 5 days per week or more discussed in detail.  Health counseling performed.  All questions answered.  Lifestyle & Preventative Health Maintenance - Advised patient to continue working toward exercising to improve overall mental, physical, and emotional health.    - Encouraged patient to engage in daily physical activity as tolerated, especially a formal exercise routine.  Recommended that the patient eventually strive for at least 150 minutes of moderate cardiovascular activity per week according to guidelines established by the [facility].   - Healthy dietary habits encouraged, including low-carb, and high amounts of lean protein in diet.   - Patient should also consume adequate amounts of water.  Safety During COVID - Novel Covid -19 counseling done; all questions were answered.   - Current CDC / federal and [place] guidelines reviewed with patient  - Reminded pt of extreme importance of social distancing; wearing a mask when out in public; insensate handwashing and cleaning of surfaces, avoiding unnecessary trips for shopping and avoiding ALL but emergency appts etc. - Told patient to be prepared, not scared; and be smart for the sake of others - Patient will call with any additional concerns  Recommendations - Return in January for CPE and full fasting blood work. - Need for influenza vaccination near future.   - As part of my medical decision making, I  reviewed the following data within the [place] History obtained from pt /family, CMA notes reviewed and incorporated if applicable, Labs reviewed, Radiograph/ tests reviewed if applicable and OV notes from prior OV's with me, as well as other specialists she/he has seen since seeing me last, were all reviewed and used in my medical decision making  process today.    - Additionally, discussion had with patient regarding our treatment plan, and their biases/concerns about that plan were used in my medical decision making today.    - The patient agreed with the plan and demonstrated an understanding of the instructions.   No barriers to understanding were identified.    - Red flag symptoms and signs discussed in detail.  Patient expressed understanding regarding what to do in case of emergency\ urgent symptoms.   - The patient was advised to call back or seek an in-person evaluation if the symptoms worsen or if the condition fails to improve as anticipated.   Return for f/up CPE and full FBW in January.    Meds ordered this encounter  Medications  . amLODIPine-Valsartan-HCTZ 5-160-12.5 MG TABS    Sig: Take 1 tablet daily    Dispense:  90 tablet    Refill:  0  . citalopram (CELEXA) 20 MG tablet    Sig: Take 1 tablet (20 mg total) by mouth daily.    Dispense:  90 tablet    Refill:  0  . Vitamin D, Ergocalciferol, (DRISDOL) 1.25 MG (50000 UT) CAPS capsule    Sig: Take 1 capsule (50,000 Units total) by mouth every 7 (seven) days.    Dispense:  12 capsule    Refill:  0    Medications Discontinued During This Encounter  Medication Reason  . triamcinolone acetonide (KENALOG) 10 MG/ML injection 10 mg   . Vitamin D, Ergocalciferol, (DRISDOL) 1.25 MG (50000 UT) CAPS capsule Reorder  . citalopram (CELEXA) 20 MG tablet Reorder  . amLODIPine-Valsartan-HCTZ 5-160-12.5 MG TABS Reorder     I provided 26 minutes of non face-to-face time during this encounter.  Additional time was  spent with charting and coordination of care after the actual visit commenced.   Note:  This note was prepared with assistance of Dragon voice recognition software. Occasional wrong-word or sound-a-like substitutions may have occurred due to the inherent limitations of voice recognition software.   This document serves as a record of services personally performed by Brianna Loteborah Tico Crotteau, DO. It was created on her behalf by Brianna FothergillKatherine Galloway, a trained medical scribe. The creation of this record is based on the scribe's personal observations and the provider's statements to them.   This case required medical decision making of at least moderate complexity. The above documentation has been reviewed to be accurate and was completed by Carlye Grippeeborah J. Falon Huesca, [name].     Patient Care Team    Relationship Specialty Notifications Start End  Brianna Lotpalski, Cicero Noy, DO PCP - General Family Medicine  08/10/17   Stanton Kidneyoledo, Teodoro K, MD Consulting Physician Gastroenterology  09/06/17   Lenn Sinkegal, Norman S, DPM Consulting Physician Podiatry  09/06/17   Head, Irineo AxonStacey M, PT  Physical Therapy  09/06/17     -Vitals obtained; medications/ allergies reconciled;  personal medical, social, Sx etc.histories were updated by CMA, reviewed by me and are reflected in chart   Patient Active Problem List   Diagnosis Date Noted  . 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    Priority: High  . Prediabetes 09/06/2017    Priority: High  . Essential hypertension 01/04/2016    Priority: High  . Obesity (BMI 35.0-39.9 without comorbidity) 01/13/2017    Priority: Medium  . Generalized anxiety disorder 09/17/2010    Priority: Medium  . S/P hysterectomy 09/06/2017    Priority: Low  . Vitamin D deficiency 10/15/2010    Priority: Low  . Menopausal hot flushes 10/31/2018  . Deep  tissue injury-  keloid formation/ inc fibroblastic activtity  06/27/2018  . Keloid of skin 06/27/2018  . Contact dermatitis and eczema due to plant 06/27/2018  .  Epigastric pain- chronic :  gnawing 06/27/2018  . Fatigue 05/17/2018  . New daily persistent headache-  likely secondary to acute increase in blood pressure 05/17/2018  . Acute maxillary sinusitis 12/08/2017  . Cough in adult 12/08/2017  . Hx of candidiasis 12/08/2017  . Plantar fascial fibromatosis of left foot 09/06/2017  . IFG (impaired fasting glucose) 01/13/2017  . Vasomotor instability 01/04/2016  . Right upper quadrant abdominal pain 12/24/2015     Current Meds  Medication Sig  . amLODIPine-Valsartan-HCTZ 5-160-12.5 MG TABS Take 1 tablet daily  . Barberry-Oreg Grape-Goldenseal (BERBERINE COMPLEX PO) Take 2 tablets by mouth daily.  . Cholecalciferol (VITAMIN D3) 5000 units CAPS Take 1 capsule by mouth daily.  . citalopram (CELEXA) 20 MG tablet Take 1 tablet (20 mg total) by mouth daily.  . Coenzyme Q10 (COQ10) 100 MG CAPS Take 1 capsule by mouth daily.  . Multiple Vitamin (MULTIVITAMIN) capsule Take 1 capsule by mouth daily.  . Probiotic Product (PROBIOTIC DAILY PO) Take 1 tablet by mouth daily. As needed  . Turmeric 500 MG CAPS Take 2 capsules by mouth daily.  . Vitamin D, Ergocalciferol, (DRISDOL) 1.25 MG (50000 UT) CAPS capsule Take 1 capsule (50,000 Units total) by mouth every 7 (seven) days.  . [DISCONTINUED] amLODIPine-Valsartan-HCTZ 5-160-12.5 MG TABS TAKE 1 TABLET BY MOUTH DAILY. OFFICE VISIT REQUIRED PRIOR TO ANY FURTHER REFILLS  . [DISCONTINUED] citalopram (CELEXA) 20 MG tablet Take 1 tablet (20 mg total) by mouth daily.  . [DISCONTINUED] Vitamin D, Ergocalciferol, (DRISDOL) 1.25 MG (50000 UT) CAPS capsule TAKE 1 CAPSULE (50,000 UNITS TOTAL) BY MOUTH EVERY 7 (SEVEN) DAYS.     Allergies:  Allergies  Allergen Reactions  . Diclofenac Diarrhea    Abdominal cramping.    ROS:  See above HPI for pertinent positives and negatives Objective:   Blood pressure 126/82, height  (1.6 m), weight 228 lb (103.4 kg).  (if some vitals are omitted, this means that patient  was UNABLE to obtain them even though they were asked to get them prior to OV today.  They were asked to call us at their earliest convenience with these once obtained. )  General: A & O * 3; sounds in no acute distress; in usual state of health.  Skin: Pt confirms warm and dry extremities and pink fingertips HEENT: Pt confirms lips non-cyanotic Chest: Patient confirms normal chest excursion and movement Respiratory: speaking in full sentences, no conversational dyspnea; patient confirms no use of accessory muscles Psych: insight appears good, mood- appears full

## 2019-11-06 ENCOUNTER — Other Ambulatory Visit: Payer: Self-pay

## 2019-11-06 ENCOUNTER — Ambulatory Visit (INDEPENDENT_AMBULATORY_CARE_PROVIDER_SITE_OTHER): Payer: BC Managed Care – PPO

## 2019-11-06 DIAGNOSIS — Z23 Encounter for immunization: Secondary | ICD-10-CM | POA: Diagnosis not present

## 2019-12-21 ENCOUNTER — Other Ambulatory Visit: Payer: Self-pay | Admitting: Family Medicine

## 2019-12-21 DIAGNOSIS — I10 Essential (primary) hypertension: Secondary | ICD-10-CM

## 2019-12-25 ENCOUNTER — Telehealth: Payer: Self-pay | Admitting: Family Medicine

## 2019-12-25 NOTE — Telephone Encounter (Signed)
12/25/2019  LVM for pt to call to discuss.  Pt last had this medication refilled on 10/16/2019.  Therefore, pt should have enough medication to last until approximately 01/16/2020.  Also, pt was instructed at last OV to schedule appt for January, 2021.  Pt has not been seen.  Tiajuana Amass, CMA

## 2019-12-25 NOTE — Telephone Encounter (Signed)
Patient is requesting a refill of her BP meds, if approved please d to [facility] on [address]

## 2019-12-25 NOTE — Telephone Encounter (Signed)
12/25/2019  Pt informed.  Pt expressed understanding.  Brianna Odom, CMA

## 2019-12-25 NOTE — Telephone Encounter (Signed)
Patient returned CMA's call .  --FYI  -glh

## 2020-01-05 ENCOUNTER — Other Ambulatory Visit: Payer: Self-pay | Admitting: Family Medicine

## 2020-01-05 DIAGNOSIS — E559 Vitamin D deficiency, unspecified: Secondary | ICD-10-CM

## 2020-01-20 ENCOUNTER — Other Ambulatory Visit: Payer: Self-pay | Admitting: Family Medicine

## 2020-01-20 DIAGNOSIS — I10 Essential (primary) hypertension: Secondary | ICD-10-CM

## 2020-01-28 ENCOUNTER — Other Ambulatory Visit: Payer: Self-pay | Admitting: Family Medicine

## 2020-01-28 DIAGNOSIS — E559 Vitamin D deficiency, unspecified: Secondary | ICD-10-CM

## 2020-01-30 ENCOUNTER — Other Ambulatory Visit: Payer: Self-pay | Admitting: Family Medicine

## 2020-01-30 DIAGNOSIS — F411 Generalized anxiety disorder: Secondary | ICD-10-CM

## 2020-01-30 DIAGNOSIS — I10 Essential (primary) hypertension: Secondary | ICD-10-CM

## 2020-02-02 ENCOUNTER — Encounter: Payer: Self-pay | Admitting: Obstetrics and Gynecology

## 2020-02-02 ENCOUNTER — Ambulatory Visit (INDEPENDENT_AMBULATORY_CARE_PROVIDER_SITE_OTHER): Payer: BC Managed Care – PPO | Admitting: Family Medicine

## 2020-02-02 ENCOUNTER — Telehealth: Payer: Self-pay

## 2020-02-02 ENCOUNTER — Other Ambulatory Visit: Payer: Self-pay

## 2020-02-02 ENCOUNTER — Encounter: Payer: Self-pay | Admitting: Family Medicine

## 2020-02-02 VITALS — BP 132/80 | HR 73 | Temp 98.4°F | Ht 63.0 in | Wt 220.2 lb

## 2020-02-02 DIAGNOSIS — E559 Vitamin D deficiency, unspecified: Secondary | ICD-10-CM

## 2020-02-02 DIAGNOSIS — R5383 Other fatigue: Secondary | ICD-10-CM

## 2020-02-02 DIAGNOSIS — Z0000 Encounter for general adult medical examination without abnormal findings: Secondary | ICD-10-CM

## 2020-02-02 DIAGNOSIS — R7303 Prediabetes: Secondary | ICD-10-CM

## 2020-02-02 DIAGNOSIS — Z1231 Encounter for screening mammogram for malignant neoplasm of breast: Secondary | ICD-10-CM

## 2020-02-02 DIAGNOSIS — R7301 Impaired fasting glucose: Secondary | ICD-10-CM

## 2020-02-02 DIAGNOSIS — E7841 Elevated Lipoprotein(a): Secondary | ICD-10-CM

## 2020-02-02 DIAGNOSIS — N949 Unspecified condition associated with female genital organs and menstrual cycle: Secondary | ICD-10-CM

## 2020-02-02 DIAGNOSIS — I10 Essential (primary) hypertension: Secondary | ICD-10-CM | POA: Diagnosis not present

## 2020-02-02 DIAGNOSIS — E669 Obesity, unspecified: Secondary | ICD-10-CM

## 2020-02-02 MED ORDER — ESTRADIOL 0.1 MG/GM VA CREA: 1.0000 | TOPICAL_CREAM | VAGINAL | 1 refills | Status: DC

## 2020-02-02 MED ORDER — ESTRADIOL 0.1 MG/GM VA CREA: 1.0000 | TOPICAL_CREAM | Freq: Every day | VAGINAL | 1 refills | Status: DC

## 2020-02-02 NOTE — Progress Notes (Signed)
Female Physical  Impression and Recommendations:    1. Encounter for wellness examination   2. Essential hypertension   3. Vitamin D deficiency   4. Prediabetes   5. IFG (impaired fasting glucose)   6. Other fatigue   7. Elevated lipoprotein(a)   8. Healthcare maintenance   9. Encounter for screening mammogram for malignant neoplasm of breast   10. Female genital disorder   70. Obesity (BMI 35.0-39.9 without comorbidity)     1) Anticipatory Guidance: Discussed skin CA prevention and sunscreen when outside along with skin surveillance; eating a balanced and modest diet; physical activity at least 25 minutes per day or minimum of 150 min/ week moderate to intense activity.  - Advised patient to engage in prudent skin screening at home.  Reviewed the A,B,C,D's of skin surveillance with patient during appointment today. - Given patient's age and history of sun exposure, discussed option of referral to dermatology for yearly screening.  2) Immunizations / Screenings / Labs:   All immunizations are up-to-date per recommendations or will be updated today if pt allows.    - Patient understands with dental and vision screens they will schedule independently.  - Will obtain CBC, CMP, HgA1c, Lipid panel, TSH and vit D when fasting, if not already done past 12 mo/ recently   -  Ambulatory referral to OBGYN provided today.   See orders. - Estrace cream provided to patient today for relief of vaginal dryness/symptoms. - Patient knows to use this cream 3 times weekly for 2 weeks, and then reduce to using 1-2 times per week for maintenance.  Explained that less is better when it comes to hormonal medications.  - Per patient, has been about two years since her last mammogram. - Need for mammogram.  Order provided today.  - Last colonoscopy obtained in 5 of 2017, repeat in 10 years.  3) Weight - Body mass index is 39.01 kg/m: BMI meaning discussed with patient.  Discussed goal to improve  diet habits to improve overall feelings of well being and objective health data. Improve nutrient density of diet through increasing intake of fruits and vegetables and decreasing saturated fats, white flour products and refined sugars.  - Advised patient to continue working toward exercising to improve overall mental, physical, and emotional health.    - Encouraged patient to engage in daily physical activity as tolerated, especially a formal exercise routine.  Recommended that the patient eventually strive for at least 150 minutes of moderate cardiovascular activity per week according to guidelines established by the [name].   - Healthy dietary habits encouraged, including low-carb, and high amounts of lean protein in diet.   - Patient should also consume adequate amounts of water.  - Health counseling performed.  All questions answered.   Meds ordered this encounter  Medications  . DISCONTD: estradiol (ESTRACE VAGINAL) 0.1 MG/GM vaginal cream    Sig: Place 1 Applicatorful vaginally at bedtime.    Dispense:  42.5 g    Refill:  1  . estradiol (ESTRACE VAGINAL) 0.1 MG/GM vaginal cream    Sig: Place 1 Applicatorful vaginally 3 (three) times a week. For 2 wks, then taper to once weekly for maintenance    Dispense:  42.5 g    Refill:  1    Orders Placed This Encounter  Procedures  . MM Digital Screening  . Ambulatory referral to Gynecology     Return for 4-82mo months.   Reminded pt important of f-up preventative CPE in 1  year.  Reminded pt again, this is in addition to any chronic care visits.    Gross side effects, risk and benefits, and alternatives of medications discussed with patient.  Patient is aware that all medications have potential side effects and we are unable to predict every side effect or drug-drug interaction that may occur.  Expresses verbal understanding and consents to current therapy plan and treatment regimen.  F-up preventative CPE in 1 year- reminded pt again,  this is in addition to any chronic care visits.    Please see orders placed and AVS handed out to patient at the end of our visit for further patient instructions/ counseling done pertaining to today's office visit.   This case required medical decision making of at least moderate complexity.  This document serves as a record of services personally performed by [name], DO. It was created on her behalf by [name], a trained medical scribe. The creation of this record is based on the scribe's personal observations and the provider's statements to them.   The above documentation from [name], medical scribe, has been reviewed by [name], [name].       Subjective:    I, [name], am serving as Education administrator for [name].   CPE HPI: Brianna Odom is a 54 y.o. female who presents to [name] today a yearly health maintenance exam.  Health Maintenance Summary  - Reviewed and updated, unless pt declines services.  Last Cologuard or Colonoscopy:  Last colonoscopy obtained in 5 of 2017, repeat in 10 years.  Tobacco History Reviewed:  Y; never smoker.  Alcohol and/or drug use:  No concerns; no use. Exercise Habits:  She has been working on losing weight.  Says she's been working on exercising, mainly walking, but "hasn't done much of it yet." Dental Home:  Stays up to date every 6 months. Eye exams:  Follows up about once per year. Dermatology home:  Denies known family history of skin cancer.  Denies dermatological concerns on her own body.  Female Health:  Has gone to [name] of OBGYN prior. STD concerns:  None, monogamous. Birth control method:  S/p hysterectomy. Menses regular:  S/p hysterectomy. Lumps or breast concerns:  None reported. Breast Cancer Family History:  None reported.  Notes she has some vaginal concerns; confirms that she feels dry and itchy in the area.  Denies discharge.  Additional  concerns beyond health maintenance issues:   She has an upcoming appointment scheduled with podiatry to address how her foot aches when she walks.    \ Immunization History  Administered Date(s) Administered  . Influenza Inj Mdck Quad Pf 09/28/2016, 09/12/2018  . Influenza, Seasonal, Injecte, Preservative Fre 01/13/2016, 09/28/2016  . Influenza,inj,Quad PF,6+ Mos 11/06/2019  . Influenza-Unspecified 09/28/2016, 08/19/2017     Health Maintenance  Topic Date Due  . Hepatitis C Screening  Never done  . HIV Screening  Never done  . TETANUS/TDAP  Never done  . MAMMOGRAM  Never done  . COLONOSCOPY  04/15/2026  . INFLUENZA VACCINE  Completed  . PAP SMEAR-Modifier  Discontinued     Wt Readings from Last 3 Encounters:  02/02/20 220 lb 3.2 oz (99.9 kg)  10/16/19 228 lb (103.4 kg)  03/02/19 228 lb (103.4 kg)   BP Readings from Last 3 Encounters:  02/02/20 132/80  10/16/19 126/82  10/31/18 122/86   Pulse Readings from Last 3 Encounters:  02/02/20 73  10/31/18 71  06/27/18 72  Past Medical History:  Diagnosis Date  . Depression   . Hypertension       Past Surgical History:  Procedure Laterality Date  . ABDOMINAL HYSTERECTOMY  2002  . BREAST BIOPSY  1981  . ROTATOR CUFF REPAIR Left 11/2018  . TONSILLECTOMY        Family History  Problem Relation Age of Onset  . Diabetes Father        pre  . Hypertension Father       Social History   Substance and Sexual Activity  Drug Use No  ,   Social History   Substance and Sexual Activity  Alcohol Use Yes  . Alcohol/week: 1.0 - 2.0 standard drinks  . Types: 1 - 2 Glasses of wine per week  ,   Social History   Tobacco Use  Smoking Status Never Smoker  Smokeless Tobacco Never Used  ,   Social History   Substance and Sexual Activity  Sexual Activity Yes  . Birth control/protection: None    Current Outpatient Medications on File Prior to Visit  Medication Sig Dispense Refill  .  amLODIPine-Valsartan-HCTZ 5-160-12.5 MG TABS TAKE 1 TABLET BY MOUTH EVERY DAY**PATIENT NEEDS APT FOR FURTHER REFILLS** 30 tablet 0  . Barberry-Oreg Grape-Goldenseal (BERBERINE COMPLEX PO) Take 2 tablets by mouth daily.    . Cholecalciferol (VITAMIN D3) 5000 units CAPS Take 1 capsule by mouth daily.    . citalopram (CELEXA) 20 MG tablet Take 1 tablet (20 mg total) by mouth daily. **PATIENT NEEDS APT FOR FURTHER REFILLS** 30 tablet 0  . Coenzyme Q10 (COQ10) 100 MG CAPS Take 1 capsule by mouth daily.    . Multiple Vitamin (MULTIVITAMIN) capsule Take 1 capsule by mouth daily.    . Probiotic Product (PROBIOTIC DAILY PO) Take 1 tablet by mouth daily. As needed    . Turmeric 500 MG CAPS Take 2 capsules by mouth daily.    . Vitamin D, Ergocalciferol, (DRISDOL) 1.25 MG (50000 UNIT) CAPS capsule TAKE 1 CAPSULE BY MOUTH EVERY 7 DAYS. OFFICE VISIT REQUIRED PRIOR TO ANY FURTHER REFILLS 4 capsule 0   No current facility-administered medications on file prior to visit.    Allergies: Diclofenac  Review of Systems: General:   Denies fever, chills, unexplained weight loss.  Optho/Auditory:   Denies visual changes, blurred vision/LOV Respiratory:   Denies SOB, DOE more than baseline levels.   Cardiovascular:   Denies chest pain, palpitations, new onset peripheral edema  Gastrointestinal:   Denies nausea, vomiting, diarrhea.  Genitourinary: Denies dysuria, freq/ urgency, flank pain or discharge from genitals.  Endocrine:     Denies hot or cold intolerance, polyuria, polydipsia. Musculoskeletal:   Denies unexplained myalgias, joint swelling, unexplained arthralgias, gait problems.  Skin:  Denies rash, suspicious lesions Neurological:     Denies dizziness, unexplained weakness, numbness  Psychiatric/Behavioral:   Denies mood changes, suicidal or homicidal ideations, hallucinations    Objective:    Blood pressure 132/80, pulse 73, temperature 98.4 F (36.9 C), temperature source Oral, height 5\' 3"  (1.6 m),  weight 220 lb 3.2 oz (99.9 kg), SpO2 97 %. Body mass index is 39.01 kg/m. General Appearance:    Alert, cooperative, no distress, appears stated age  Head:    Normocephalic, without obvious abnormality, atraumatic  Eyes:    PERRL, conjunctiva/corneas clear, EOM's intact, fundi    benign, both eyes  Ears:    Normal TM's and external ear canals, both ears  Nose:   Nares normal, septum midline, mucosa normal, no drainage  or sinus tenderness  Throat:   Lips w/o lesion, mucosa moist, and tongue normal; teeth and   gums normal  Neck:   Supple, symmetrical, trachea midline, no adenopathy;    thyroid:  no enlargement/tenderness/nodules; no carotid   bruit or JVD  Back:     Symmetric, no curvature, ROM normal, no CVA tenderness  Lungs:     Clear to auscultation bilaterally, respirations unlabored, no       Wh/ R/ R  Chest Wall:    No tenderness or gross deformity; normal excursion   Heart:    Regular rate and rhythm, S1 and S2 normal, no murmur, rub   or gallop  Breast Exam:    Deferred to OBGYN.  Abdomen:     small periumbilical hernia present that is easily reducible.  Otherwise, soft, non-tender, bowel sounds active all four quadrants, NO   G/R/R, no masses, no organomegaly  Genitalia:    Deferred to OBGYN.  Rectal:    Deferred.  Extremities:   Extremities normal, atraumatic, no cyanosis or gross edema  Pulses:   2+ and symmetric all extremities  Skin:   Warm, dry, Skin color, texture, turgor normal, no obvious rashes or lesions Psych: No HI/SI, judgement and insight good, Euthymic mood. Full Affect.  Neurologic:   CNII-XII intact, normal strength, sensation and reflexes    Throughout

## 2020-02-02 NOTE — Patient Instructions (Addendum)
After you have received your second COVID-19 vaccine, please call the office to schedule your shingles and tetanus shot (TDAP).    Preventive Care for Adults, Female  A healthy lifestyle and preventive care can promote health and wellness. Preventive health guidelines for women include the following key practices.   A routine yearly physical is a good way to check with your health care provider about your health and preventive screening. It is a chance to share any concerns and updates on your health and to receive a thorough exam.   Visit your dentist for a routine exam and preventive care every 6 months. Brush your teeth twice a day and floss once a day. Good oral hygiene prevents tooth decay and gum disease.   The frequency of eye exams is based on your age, health, family medical history, use of contact lenses, and other factors. Follow your health care provider's recommendations for frequency of eye exams.   Eat a healthy diet. Foods like vegetables, fruits, whole grains, low-fat dairy products, and lean protein foods contain the nutrients you need without too many calories. Decrease your intake of foods high in solid fats, added sugars, and salt. Eat the right amount of calories for you.Get information about a proper diet from your health care provider, if necessary.   Regular physical exercise is one of the most important things you can do for your health. Most adults should get at least 150 minutes of moderate-intensity exercise (any activity that increases your heart rate and causes you to sweat) each week. In addition, most adults need muscle-strengthening exercises on 2 or more days a week.   Maintain a healthy weight. The body mass index (BMI) is a screening tool to identify possible weight problems. It provides an estimate of body fat based on height and weight. Your health care provider can find your BMI, and can help you achieve or maintain a healthy weight.For adults 20  years and older:   - A BMI below 18.5 is considered underweight.   - A BMI of 18.5 to 24.9 is normal.   - A BMI of 25 to 29.9 is considered overweight.   - A BMI of 30 and above is considered obese.   Maintain normal blood lipids and cholesterol levels by exercising and minimizing your intake of trans and saturated fats.  Eat a balanced diet with plenty of fruit and vegetables. Blood tests for lipids and cholesterol should begin at age 12 and be repeated every 5 years minimum.  If your lipid or cholesterol levels are high, you are over 40, or you are at high risk for heart disease, you may need your cholesterol levels checked more frequently.Ongoing high lipid and cholesterol levels should be treated with medicines if diet and exercise are not working.   If you smoke, find out from your health care provider how to quit. If you do not use tobacco, do not start.   Lung cancer screening is recommended for adults aged 86-80 years who are at high risk for developing lung cancer because of a history of smoking. A yearly low-dose CT scan of the lungs is recommended for people who have at least a 30-pack-year history of smoking and are a current smoker or have quit within the past 15 years. A pack year of smoking is smoking an average of 1 pack of cigarettes a day for 1 year (for example: 1 pack a day for 30 years or 2 packs a day for 15 years). Yearly screening  should continue until the smoker has stopped smoking for at least 15 years. Yearly screening should be stopped for people who develop a health problem that would prevent them from having lung cancer treatment.   If you are pregnant, do not drink alcohol. If you are breastfeeding, be very cautious about drinking alcohol. If you are not pregnant and choose to drink alcohol, do not have more than 1 drink per day. One drink is considered to be 12 ounces (355 mL) of beer, 5 ounces (148 mL) of wine, or 1.5 ounces (44 mL) of liquor.   Avoid use of  street drugs. Do not share needles with anyone. Ask for help if you need support or instructions about stopping the use of drugs.   High blood pressure causes heart disease and increases the risk of stroke. Your blood pressure should be checked at least yearly.  Ongoing high blood pressure should be treated with medicines if weight loss and exercise do not work.   If you are 80-68 years old, ask your health care provider if you should take aspirin to prevent strokes.   Diabetes screening involves taking a blood sample to check your fasting blood sugar level. This should be done once every 3 years, after age 26, if you are within normal weight and without risk factors for diabetes. Testing should be considered at a younger age or be carried out more frequently if you are overweight and have at least 1 risk factor for diabetes.   Breast cancer screening is essential preventive care for women. You should practice "breast self-awareness."  This means understanding the normal appearance and feel of your breasts and may include breast self-examination.  Any changes detected, no matter how small, should be reported to a health care provider.  Women in their 75s and 30s should have a clinical breast exam (CBE) by a health care provider as part of a regular health exam every 1 to 3 years.  After age 82, women should have a CBE every year.  Starting at age 33, women should consider having a mammogram (breast X-ray test) every year.  Women who have a family history of breast cancer should talk to their health care provider about genetic screening.  Women at a high risk of breast cancer should talk to their health care providers about having an MRI and a mammogram every year.   -Breast cancer gene (BRCA)-related cancer risk assessment is recommended for women who have family members with BRCA-related cancers. BRCA-related cancers include breast, ovarian, tubal, and peritoneal cancers. Having family members with  these cancers may be associated with an increased risk for harmful changes (mutations) in the breast cancer genes BRCA1 and BRCA2. Results of the assessment will determine the need for genetic counseling and BRCA1 and BRCA2 testing.   The Pap test is a screening test for cervical cancer. A Pap test can show cell changes on the cervix that might become cervical cancer if left untreated. A Pap test is a procedure in which cells are obtained and examined from the lower end of the uterus (cervix).   - Women should have a Pap test starting at age 68.   - Between ages 75 and 13, Pap tests should be repeated every 2 years.   - Beginning at age 79, you should have a Pap test every 3 years as long as the past 3 Pap tests have been normal.   - Some women have medical problems that increase the chance of getting cervical  cancer. Talk to your health care provider about these problems. It is especially important to talk to your health care provider if a new problem develops soon after your last Pap test. In these cases, your health care provider may recommend more frequent screening and Pap tests.   - The above recommendations are the same for women who have or have not gotten the vaccine for human papillomavirus (HPV).   - If you had a hysterectomy for a problem that was not cancer or a condition that could lead to cancer, then you no longer need Pap tests. Even if you no longer need a Pap test, a regular exam is a good idea to make sure no other problems are starting.   - If you are between ages 4 and 98 years, and you have had normal Pap tests going back 10 years, you no longer need Pap tests. Even if you no longer need a Pap test, a regular exam is a good idea to make sure no other problems are starting.   - If you have had past treatment for cervical cancer or a condition that could lead to cancer, you need Pap tests and screening for cancer for at least 20 years after your treatment.   - If Pap tests  have been discontinued, risk factors (such as a new sexual partner) need to be reassessed to determine if screening should be resumed.   - The HPV test is an additional test that may be used for cervical cancer screening. The HPV test looks for the virus that can cause the cell changes on the cervix. The cells collected during the Pap test can be tested for HPV. The HPV test could be used to screen women aged 95 years and older, and should be used in women of any age who have unclear Pap test results. After the age of 48, women should have HPV testing at the same frequency as a Pap test.   Colorectal cancer can be detected and often prevented. Most routine colorectal cancer screening begins at the age of 42 years and continues through age 10 years. However, your health care provider may recommend screening at an earlier age if you have risk factors for colon cancer. On a yearly basis, your health care provider may provide home test kits to check for hidden blood in the stool.  Use of a small camera at the end of a tube, to directly examine the colon (sigmoidoscopy or colonoscopy), can detect the earliest forms of colorectal cancer. Talk to your health care provider about this at age 58, when routine screening begins. Direct exam of the colon should be repeated every 5 -10 years through age 43 years, unless early forms of pre-cancerous polyps or small growths are found.   People who are at an increased risk for hepatitis B should be screened for this virus. You are considered at high risk for hepatitis B if:  -You were born in a country where hepatitis B occurs often. Talk with your health care provider about which countries are considered high risk.  - Your parents were born in a high-risk country and you have not received a shot to protect against hepatitis B (hepatitis B vaccine).  - You have HIV or AIDS.  - You use needles to inject street drugs.  - You live with, or have sex with, someone who has  Hepatitis B.  - You get hemodialysis treatment.  - You take certain medicines for conditions like cancer, organ  transplantation, and autoimmune conditions.   Hepatitis C blood testing is recommended for all people born from [birth year] through 1967 and any individual with known risks for hepatitis C.   Practice safe sex. Use condoms and avoid high-risk sexual practices to reduce the spread of sexually transmitted infections (STIs). STIs include gonorrhea, chlamydia, syphilis, trichomonas, herpes, HPV, and human immunodeficiency virus (HIV). Herpes, HIV, and HPV are viral illnesses that have no cure. They can result in disability, cancer, and death. Sexually active women aged 85 years and younger should be checked for chlamydia. Older women with new or multiple partners should also be tested for chlamydia. Testing for other STIs is recommended if you are sexually active and at increased risk.   Osteoporosis is a disease in which the bones lose minerals and strength with aging. This can result in serious bone fractures or breaks. The risk of osteoporosis can be identified using a bone density scan. Women ages 30 years and over and women at risk for fractures or osteoporosis should discuss screening with their health care providers. Ask your health care provider whether you should take a calcium supplement or vitamin D to There are also several preventive steps women can take to avoid osteoporosis and resulting fractures or to keep osteoporosis from worsening. -->Recommendations include:  Eat a balanced diet high in fruits, vegetables, calcium, and vitamins.  Get enough calcium. The recommended total intake of is 1,200 mg daily; for best absorption, if taking supplements, divide doses into 250-500 mg doses throughout the day. Of the two types of calcium, calcium carbonate is best absorbed when taken with food but calcium citrate can be taken on an empty stomach.  Get enough vitamin D. [employer] and the [employer] recommend at least 1,000 IU per day for women age 50 and over who are at risk of vitamin D deficiency. Vitamin D deficiency can be caused by inadequate sun exposure (for example, those who live in [place]).  Avoid alcohol and smoking. Heavy alcohol intake (more than 7 drinks per week) increases the risk of falls and hip fracture and women smokers tend to lose bone more rapidly and have lower bone mass than nonsmokers. Stopping smoking is one of the most important changes women can make to improve their health and decrease risk for disease.  Be physically active every day. Weight-bearing exercise (for example, fast walking, hiking, jogging, and weight training) may strengthen bones or slow the rate of bone loss that comes with aging. Balancing and muscle-strengthening exercises can reduce the risk of falling and fracture.  Consider therapeutic medications. Currently, several types of effective drugs are available. Healthcare providers can recommend the type most appropriate for each woman.  Eliminate environmental factors that may contribute to accidents. Falls cause nearly 90% of all osteoporotic fractures, so reducing this risk is an important bone-health strategy. Measures include ample lighting, removing obstructions to walking, using nonskid rugs on floors, and placing mats and/or grab bars in showers.  Be aware of medication side effects. Some common medicines make bones weaker. These include a type of steroid drug called glucocorticoids used for arthritis and asthma, some antiseizure drugs, certain sleeping pills, treatments for endometriosis, and some cancer drugs. An overactive thyroid gland or using too much thyroid hormone for an underactive thyroid can also be a problem. If you are taking these medicines, talk to your doctor about what you can do to help protect your bones.reduce the rate of osteoporosis.    Menopause can be associated with  physical  symptoms and risks. Hormone replacement therapy is available to decrease symptoms and risks. You should talk to your health care provider about whether hormone replacement therapy is right for you.   Use sunscreen. Apply sunscreen liberally and repeatedly throughout the day. You should seek shade when your shadow is shorter than you. Protect yourself by wearing long sleeves, pants, a wide-brimmed hat, and sunglasses year round, whenever you are outdoors.   Once a month, do a whole body skin exam, using a mirror to look at the skin on your back. Tell your health care provider of new moles, moles that have irregular borders, moles that are larger than a pencil eraser, or moles that have changed in shape or color.   -Stay current with required vaccines (immunizations).   Influenza vaccine. All adults should be immunized every year.  Tetanus, diphtheria, and acellular pertussis (Td, Tdap) vaccine. Pregnant women should receive 1 dose of Tdap vaccine during each pregnancy. The dose should be obtained regardless of the length of time since the last dose. Immunization is preferred during the 27th 36th week of gestation. An adult who has not previously received Tdap or who does not know her vaccine status should receive 1 dose of Tdap. This initial dose should be followed by tetanus and diphtheria toxoids (Td) booster doses every 10 years. Adults with an unknown or incomplete history of completing a 3-dose immunization series with Td-containing vaccines should begin or complete a primary immunization series including a Tdap dose. Adults should receive a Td booster every 10 years.  Varicella vaccine. An adult without evidence of immunity to varicella should receive 2 doses or a second dose if she has previously received 1 dose. Pregnant females who do not have evidence of immunity should receive the first dose after pregnancy. This first dose should be obtained before leaving the health care facility. The  second dose should be obtained 4 8 weeks after the first dose.  Human papillomavirus (HPV) vaccine. Females aged 54 26 years who have not received the vaccine previously should obtain the 3-dose series. The vaccine is not recommended for use in pregnant females. However, pregnancy testing is not needed before receiving a dose. If a female is found to be pregnant after receiving a dose, no treatment is needed. In that case, the remaining doses should be delayed until after the pregnancy. Immunization is recommended for any person with an immunocompromised condition through the age of 26 years if she did not get any or all doses earlier. During the 3-dose series, the second dose should be obtained 4 8 weeks after the first dose. The third dose should be obtained 24 weeks after the first dose and 16 weeks after the second dose.  Zoster vaccine. One dose is recommended for adults aged 32 years or older unless certain conditions are present.  Measles, mumps, and rubella (MMR) vaccine. Adults born before 45 generally are considered immune to measles and mumps. Adults born in [birth year] or later should have 1 or more doses of MMR vaccine unless there is a contraindication to the vaccine or there is laboratory evidence of immunity to each of the three diseases. A routine second dose of MMR vaccine should be obtained at least 28 days after the first dose for students attending postsecondary schools, health care workers, or international travelers. People who received inactivated measles vaccine or an unknown type of measles vaccine during 1963 1967 should receive 2 doses of MMR vaccine. People who received inactivated mumps vaccine or  an unknown type of mumps vaccine before 1979 and are at high risk for mumps infection should consider immunization with 2 doses of MMR vaccine. For females of childbearing age, rubella immunity should be determined. If there is no evidence of immunity, females who are not pregnant should be  vaccinated. If there is no evidence of immunity, females who are pregnant should delay immunization until after pregnancy. Unvaccinated health care workers born before [birth year] who lack laboratory evidence of measles, mumps, or rubella immunity or laboratory confirmation of disease should consider measles and mumps immunization with 2 doses of MMR vaccine or rubella immunization with 1 dose of MMR vaccine.  Pneumococcal 13-valent conjugate (PCV13) vaccine. When indicated, a person who is uncertain of her immunization history and has no record of immunization should receive the PCV13 vaccine. An adult aged 19 years or older who has certain medical conditions and has not been previously immunized should receive 1 dose of PCV13 vaccine. This PCV13 should be followed with a dose of pneumococcal polysaccharide (PPSV23) vaccine. The PPSV23 vaccine dose should be obtained at least 8 weeks after the dose of PCV13 vaccine. An adult aged 20 years or older who has certain medical conditions and previously received 1 or more doses of PPSV23 vaccine should receive 1 dose of PCV13. The PCV13 vaccine dose should be obtained 1 or more years after the last PPSV23 vaccine dose.  Pneumococcal polysaccharide (PPSV23) vaccine. When PCV13 is also indicated, PCV13 should be obtained first. All adults aged 2 years and older should be immunized. An adult younger than age 23 years who has certain medical conditions should be immunized. Any person who resides in a nursing home or long-term care facility should be immunized. An adult smoker should be immunized. People with an immunocompromised condition and certain other conditions should receive both PCV13 and PPSV23 vaccines. People with human immunodeficiency virus (HIV) infection should be immunized as soon as possible after diagnosis. Immunization during chemotherapy or radiation therapy should be avoided. Routine use of PPSV23 vaccine is not recommended for American Indians, [place]  Natives, or people younger than 65 years unless there are medical conditions that require PPSV23 vaccine. When indicated, people who have unknown immunization and have no record of immunization should receive PPSV23 vaccine. One-time revaccination 5 years after the first dose of PPSV23 is recommended for people aged 37 64 years who have chronic kidney failure, nephrotic syndrome, asplenia, or immunocompromised conditions. People who received 1 2 doses of PPSV23 before age 30 years should receive another dose of PPSV23 vaccine at age 85 years or later if at least 5 years have passed since the previous dose. Doses of PPSV23 are not needed for people immunized with PPSV23 at or after age 79 years.  Meningococcal vaccine. Adults with asplenia or persistent complement component deficiencies should receive 2 doses of quadrivalent meningococcal conjugate (MenACWY-D) vaccine. The doses should be obtained at least 2 months apart. Microbiologists working with certain meningococcal bacteria, [place] recruits, people at risk during an outbreak, and people who travel to or live in countries with a high rate of meningitis should be immunized. A first-year college student up through age 31 years who is living in a residence hall should receive a dose if she did not receive a dose on or after her 16th birthday. Adults who have certain high-risk conditions should receive one or more doses of vaccine.  Hepatitis A vaccine. Adults who wish to be protected from this disease, have certain high-risk conditions, work with hepatitis A-infected  animals, work in hepatitis A research labs, or travel to or work in countries with a high rate of hepatitis A should be immunized. Adults who were previously unvaccinated and who anticipate close contact with an international adoptee during the first 60 days after arrival in the [place] from a country with a high rate of hepatitis A should be immunized.  Hepatitis B vaccine.  Adults who  wish to be protected from this disease, have certain high-risk conditions, may be exposed to blood or other infectious body fluids, are household contacts or sex partners of hepatitis B positive people, are clients or workers in certain care facilities, or travel to or work in countries with a high rate of hepatitis B should be immunized.  Haemophilus influenzae type b (Hib) vaccine. A previously unvaccinated person with asplenia or sickle cell disease or having a scheduled splenectomy should receive 1 dose of Hib vaccine. Regardless of previous immunization, a recipient of a hematopoietic stem cell transplant should receive a 3-dose series 6 12 months after her successful transplant. Hib vaccine is not recommended for adults with HIV infection.  Preventive Services / Frequency Ages 31 to 39years  Blood pressure check.** / Every 1 to 2 years.  Lipid and cholesterol check.** / Every 5 years beginning at age 75.  Clinical breast exam.** / Every 3 years for women in their 46s and 40s.  BRCA-related cancer risk assessment.** / For women who have family members with a BRCA-related cancer (breast, ovarian, tubal, or peritoneal cancers).  Pap test.** / Every 2 years from ages 45 through 56. Every 3 years starting at age 74 through age 80 or 1 with a history of 3 consecutive normal Pap tests.  HPV screening.** / Every 3 years from ages 69 through ages 66 to 69 with a history of 3 consecutive normal Pap tests.  Hepatitis C blood test.** / For any individual with known risks for hepatitis C.  Skin self-exam. / Monthly.  Influenza vaccine. / Every year.  Tetanus, diphtheria, and acellular pertussis (Tdap, Td) vaccine.** / Consult your health care provider. Pregnant women should receive 1 dose of Tdap vaccine during each pregnancy. 1 dose of Td every 10 years.  Varicella vaccine.** / Consult your health care provider. Pregnant females who do not have evidence of immunity should receive the first dose  after pregnancy.  HPV vaccine. / 3 doses over 6 months, if 38 and younger. The vaccine is not recommended for use in pregnant females. However, pregnancy testing is not needed before receiving a dose.  Measles, mumps, rubella (MMR) vaccine.** / You need at least 1 dose of MMR if you were born in 1957 or later. You may also need a 2nd dose. For females of childbearing age, rubella immunity should be determined. If there is no evidence of immunity, females who are not pregnant should be vaccinated. If there is no evidence of immunity, females who are pregnant should delay immunization until after pregnancy.  Pneumococcal 13-valent conjugate (PCV13) vaccine.** / Consult your health care provider.  Pneumococcal polysaccharide (PPSV23) vaccine.** / 1 to 2 doses if you smoke cigarettes or if you have certain conditions.  Meningococcal vaccine.** / 1 dose if you are age 57 to 64 years and a Market researcher living in a residence hall, or have one of several medical conditions, you need to get vaccinated against meningococcal disease. You may also need additional booster doses.  Hepatitis A vaccine.** / Consult your health care provider.  Hepatitis B vaccine.** /  Consult your health care provider.  Haemophilus influenzae type b (Hib) vaccine.** / Consult your health care provider.  Ages 63 to 64years  Blood pressure check.** / Every 1 to 2 years.  Lipid and cholesterol check.** / Every 5 years beginning at age 58 years.  Lung cancer screening. / Every year if you are aged 31 80 years and have a 30-pack-year history of smoking and currently smoke or have quit within the past 15 years. Yearly screening is stopped once you have quit smoking for at least 15 years or develop a health problem that would prevent you from having lung cancer treatment.  Clinical breast exam.** / Every year after age 22 years.  BRCA-related cancer risk assessment.** / For women who have family members with a  BRCA-related cancer (breast, ovarian, tubal, or peritoneal cancers).  Mammogram.** / Every year beginning at age 60 years and continuing for as long as you are in good health. Consult with your health care provider.  Pap test.** / Every 3 years starting at age 66 years through age 62 or 78 years with a history of 3 consecutive normal Pap tests.  HPV screening.** / Every 3 years from ages 29 years through ages 28 to 22 years with a history of 3 consecutive normal Pap tests.  Fecal occult blood test (FOBT) of stool. / Every year beginning at age 69 years and continuing until age 45 years. You may not need to do this test if you get a colonoscopy every 10 years.  Flexible sigmoidoscopy or colonoscopy.** / Every 5 years for a flexible sigmoidoscopy or every 10 years for a colonoscopy beginning at age 69 years and continuing until age 10 years.  Hepatitis C blood test.** / For all people born from [birth year] through 1967 and any individual with known risks for hepatitis C.  Skin self-exam. / Monthly.  Influenza vaccine. / Every year.  Tetanus, diphtheria, and acellular pertussis (Tdap/Td) vaccine.** / Consult your health care provider. Pregnant women should receive 1 dose of Tdap vaccine during each pregnancy. 1 dose of Td every 10 years.  Varicella vaccine.** / Consult your health care provider. Pregnant females who do not have evidence of immunity should receive the first dose after pregnancy.  Zoster vaccine.** / 1 dose for adults aged 35 years or older.  Measles, mumps, rubella (MMR) vaccine.** / You need at least 1 dose of MMR if you were born in 1957 or later. You may also need a 2nd dose. For females of childbearing age, rubella immunity should be determined. If there is no evidence of immunity, females who are not pregnant should be vaccinated. If there is no evidence of immunity, females who are pregnant should delay immunization until after pregnancy.  Pneumococcal 13-valent conjugate  (PCV13) vaccine.** / Consult your health care provider.  Pneumococcal polysaccharide (PPSV23) vaccine.** / 1 to 2 doses if you smoke cigarettes or if you have certain conditions.  Meningococcal vaccine.** / Consult your health care provider.  Hepatitis A vaccine.** / Consult your health care provider.  Hepatitis B vaccine.** / Consult your health care provider.  Haemophilus influenzae type b (Hib) vaccine.** / Consult your health care provider.  Ages 43 years and over  Blood pressure check.** / Every 1 to 2 years.  Lipid and cholesterol check.** / Every 5 years beginning at age 104 years.  Lung cancer screening. / Every year if you are aged 42 80 years and have a 30-pack-year history of smoking and currently smoke or have quit within  the past 15 years. Yearly screening is stopped once you have quit smoking for at least 15 years or develop a health problem that would prevent you from having lung cancer treatment.  Clinical breast exam.** / Every year after age 16 years.  BRCA-related cancer risk assessment.** / For women who have family members with a BRCA-related cancer (breast, ovarian, tubal, or peritoneal cancers).  Mammogram.** / Every year beginning at age 53 years and continuing for as long as you are in good health. Consult with your health care provider.  Pap test.** / Every 3 years starting at age 67 years through age 51 or 39 years with 3 consecutive normal Pap tests. Testing can be stopped between 65 and 70 years with 3 consecutive normal Pap tests and no abnormal Pap or HPV tests in the past 10 years.  HPV screening.** / Every 3 years from ages 34 years through ages 34 or 93 years with a history of 3 consecutive normal Pap tests. Testing can be stopped between 65 and 70 years with 3 consecutive normal Pap tests and no abnormal Pap or HPV tests in the past 10 years.  Fecal occult blood test (FOBT) of stool. / Every year beginning at age 1 years and continuing until age 66  years. You may not need to do this test if you get a colonoscopy every 10 years.  Flexible sigmoidoscopy or colonoscopy.** / Every 5 years for a flexible sigmoidoscopy or every 10 years for a colonoscopy beginning at age 73 years and continuing until age 70 years.  Hepatitis C blood test.** / For all people born from [birth year] through 1967 and any individual with known risks for hepatitis C.  Osteoporosis screening.** / A one-time screening for women ages 42 years and over and women at risk for fractures or osteoporosis.  Skin self-exam. / Monthly.  Influenza vaccine. / Every year.  Tetanus, diphtheria, and acellular pertussis (Tdap/Td) vaccine.** / 1 dose of Td every 10 years.  Varicella vaccine.** / Consult your health care provider.  Zoster vaccine.** / 1 dose for adults aged 22 years or older.  Pneumococcal 13-valent conjugate (PCV13) vaccine.** / Consult your health care provider.  Pneumococcal polysaccharide (PPSV23) vaccine.** / 1 dose for all adults aged 42 years and older.  Meningococcal vaccine.** / Consult your health care provider.  Hepatitis A vaccine.** / Consult your health care provider.  Hepatitis B vaccine.** / Consult your health care provider.  Haemophilus influenzae type b (Hib) vaccine.** / Consult your health care provider. ** Family history and personal history of risk and conditions may change your health care provider's recommendations. Document Released: 01/05/2002 Document Revised: 08/30/2013  [facility] Patient Information 2014 [place], [place].   EXERCISE AND DIET:  We recommended that you start or continue a regular exercise program for good health. Regular exercise means any activity that makes your heart beat faster and makes you sweat.  We recommend exercising at least 30 minutes per day at least 3 days a week, preferably 5.  We also recommend a diet low in fat and sugar / carbohydrates.  Inactivity, poor dietary choices and obesity can cause diabetes,  heart attack, stroke, and kidney damage, among others.     ALCOHOL AND SMOKING:  Women should limit their alcohol intake to no more than 7 drinks/beers/glasses of wine (combined, not each!) per week. Moderation of alcohol intake to this level decreases your risk of breast cancer and liver damage.  ( And of course, no recreational drugs are part of a  healthy lifestyle.)  Also, you should not be smoking at all or even being exposed to second hand smoke. Most people know smoking can cause cancer, and various heart and lung diseases, but did you know it also contributes to weakening of your bones?  Aging of your skin?  Yellowing of your teeth and nails?   CALCIUM AND VITAMIN D:  Adequate intake of calcium and Vitamin D are recommended.  The recommendations for exact amounts of these supplements seem to change often, but generally speaking 600 mg of calcium (either carbonate or citrate) and 800 units of Vitamin D per day seems prudent. Certain women may benefit from higher intake of Vitamin D.  If you are among these women, your doctor will have told you during your visit.     PAP SMEARS:  Pap smears, to check for cervical cancer or precancers,  have traditionally been done yearly, although recent scientific advances have shown that most women can have pap smears less often.  However, every woman still should have a physical exam from her gynecologist or primary care physician every year. It will include a breast check, inspection of the vulva and vagina to check for abnormal growths or skin changes, a visual exam of the cervix, and then an exam to evaluate the size and shape of the uterus and ovaries.  And after 56 years of age, a rectal exam is indicated to check for rectal cancers. We will also provide age appropriate advice regarding health maintenance, like when you should have certain vaccines, screening for sexually transmitted diseases, bone density testing, colonoscopy, mammograms, etc.     MAMMOGRAMS:  All women over 26 years old should have a yearly mammogram. Many facilities now offer a "3D" mammogram, which may cost around $50 extra out of pocket. If possible,  we recommend you accept the option to have the 3D mammogram performed.  It both reduces the number of women who will be called back for extra views which then turn out to be normal, and it is better than the routine mammogram at detecting truly abnormal areas.     COLONOSCOPY:  Colonoscopy to screen for colon cancer is recommended for all women at age 51.  We know, you hate the idea of the prep.  We agree, BUT, having colon cancer and not knowing it is worse!!  Colon cancer so often starts as a polyp that can be seen and removed at colonscopy, which can quite literally save your life!  And if your first colonoscopy is normal and you have no family history of colon cancer, most women don't have to have it again for 10 years.  Once every ten years, you can do something that may end up saving your life, right?  We will be happy to help you get it scheduled when you are ready.  Be sure to check your insurance coverage so you understand how much it will cost.  It may be covered as a preventative service at no cost, but you should check your particular policy.

## 2020-02-02 NOTE — Telephone Encounter (Signed)
/  10/2020  Called pharmacy and cancelled RX for estradiol that was sent electronically per Dr. Sharee Holster.  Pt was given hard copy of RX with a change in sig to take to the pharmacy.  Tiajuana Amass, CMA

## 2020-02-03 LAB — CBC WITH DIFFERENTIAL/PLATELET
Basophils Absolute: 0 10*3/uL (ref 0.0–0.2)
Basos: 1 %
EOS (ABSOLUTE): 0.1 10*3/uL (ref 0.0–0.4)
Eos: 2 %
Hematocrit: 44.1 % (ref 34.0–46.6)
Hemoglobin: 14.1 g/dL (ref 11.1–15.9)
Immature Grans (Abs): 0 10*3/uL (ref 0.0–0.1)
Immature Granulocytes: 0 %
Lymphocytes Absolute: 1.3 10*3/uL (ref 0.7–3.1)
Lymphs: 30 %
MCH: 28.1 pg (ref 26.6–33.0)
MCHC: 32 g/dL (ref 31.5–35.7)
MCV: 88 fL (ref 79–97)
Monocytes Absolute: 0.4 10*3/uL (ref 0.1–0.9)
Monocytes: 9 %
Neutrophils Absolute: 2.5 10*3/uL (ref 1.4–7.0)
Neutrophils: 58 %
Platelets: 245 10*3/uL (ref 150–450)
RBC: 5.01 x10E6/uL (ref 3.77–5.28)
RDW: 13.5 % (ref 11.7–15.4)
WBC: 4.2 10*3/uL (ref 3.4–10.8)

## 2020-02-03 LAB — COMPREHENSIVE METABOLIC PANEL
ALT: 42 IU/L — ABNORMAL HIGH (ref 0–32)
AST: 28 IU/L (ref 0–40)
Albumin/Globulin Ratio: 2 (ref 1.2–2.2)
Albumin: 4.6 g/dL (ref 3.8–4.9)
Alkaline Phosphatase: 115 IU/L (ref 39–117)
BUN/Creatinine Ratio: 19 (ref 9–23)
BUN: 13 mg/dL (ref 6–24)
Bilirubin Total: 0.3 mg/dL (ref 0.0–1.2)
CO2: 22 mmol/L (ref 20–29)
Calcium: 9.2 mg/dL (ref 8.7–10.2)
Chloride: 104 mmol/L (ref 96–106)
Creatinine, Ser: 0.67 mg/dL (ref 0.57–1.00)
GFR calc Af Amer: 114 mL/min/{1.73_m2} (ref >59)
GFR calc non Af Amer: 99 mL/min/{1.73_m2} (ref >59)
Globulin, Total: 2.3 g/dL (ref 1.5–4.5)
Glucose: 96 mg/dL (ref 65–99)
Potassium: 4.7 mmol/L (ref 3.5–5.2)
Sodium: 143 mmol/L (ref 134–144)
Total Protein: 6.9 g/dL (ref 6.0–8.5)

## 2020-02-03 LAB — HEMOGLOBIN A1C
Est. average glucose Bld gHb Est-mCnc: 117 mg/dL
Hgb A1c MFr Bld: 5.7 % — ABNORMAL HIGH (ref 4.8–5.6)

## 2020-02-03 LAB — LIPID PANEL
Chol/HDL Ratio: 3.2 ratio (ref 0.0–4.4)
Cholesterol, Total: 183 mg/dL (ref 100–199)
HDL: 58 mg/dL (ref >39)
LDL Chol Calc (NIH): 112 mg/dL — ABNORMAL HIGH (ref 0–99)
Triglycerides: 69 mg/dL (ref 0–149)
VLDL Cholesterol Cal: 13 mg/dL (ref 5–40)

## 2020-02-03 LAB — T4, FREE: Free T4: 1.24 ng/dL (ref 0.82–1.77)

## 2020-02-03 LAB — VITAMIN D 25 HYDROXY (VIT D DEFICIENCY, FRACTURES): Vit D, 25-Hydroxy: 51.7 ng/mL (ref 30.0–100.0)

## 2020-02-03 LAB — TSH: TSH: 3.62 u[IU]/mL (ref 0.450–4.500)

## 2020-02-12 ENCOUNTER — Encounter: Payer: Self-pay | Admitting: Family Medicine

## 2020-02-14 ENCOUNTER — Encounter: Payer: Self-pay | Admitting: Podiatry

## 2020-02-14 ENCOUNTER — Other Ambulatory Visit: Payer: Self-pay

## 2020-02-14 ENCOUNTER — Ambulatory Visit: Payer: BC Managed Care – PPO | Admitting: Podiatry

## 2020-02-14 ENCOUNTER — Ambulatory Visit (INDEPENDENT_AMBULATORY_CARE_PROVIDER_SITE_OTHER): Payer: BC Managed Care – PPO

## 2020-02-14 DIAGNOSIS — M7752 Other enthesopathy of left foot: Secondary | ICD-10-CM

## 2020-02-14 DIAGNOSIS — M7672 Peroneal tendinitis, left leg: Secondary | ICD-10-CM

## 2020-02-14 DIAGNOSIS — M779 Enthesopathy, unspecified: Secondary | ICD-10-CM

## 2020-02-14 NOTE — Progress Notes (Signed)
Subjective:   Patient ID: Brianna Odom, female   DOB: 54 y.o.   MRN: [id number]   HPI Patient presents stating has developed a lot of pain in the outside of the left foot and it sore and making it hard to walk comfortably.  States it is inflamed and gradually becoming more of an issue for   ROS      Objective:  Physical Exam  Neurovascular status intact with inflammation base of fifth metatarsal left with fluid buildup around this area and more around the peroneal tertius group along with the peroneal     Assessment:  Peroneal tertius and peroneal brevis inflammation left     Plan:  H&P education x-ray reviewed today I did sterile prep injected the tendon complex 3 mg Kenalog 5 mg Xylocaine and instructed on fascial brace to lift up the outside of the foot and ice therapy.  May require orthotics or other treatment and will reevaluate in the next several weeks  X-rays indicate there is some slight reactivity around the base of the fifth metatarsal no indications of acute injury

## 2020-02-19 ENCOUNTER — Other Ambulatory Visit: Payer: Self-pay | Admitting: Family Medicine

## 2020-02-19 DIAGNOSIS — E559 Vitamin D deficiency, unspecified: Secondary | ICD-10-CM

## 2020-02-21 ENCOUNTER — Other Ambulatory Visit: Payer: Self-pay | Admitting: Family Medicine

## 2020-02-21 DIAGNOSIS — I10 Essential (primary) hypertension: Secondary | ICD-10-CM

## 2020-02-25 ENCOUNTER — Other Ambulatory Visit: Payer: Self-pay | Admitting: Family Medicine

## 2020-02-25 DIAGNOSIS — F411 Generalized anxiety disorder: Secondary | ICD-10-CM

## 2020-02-26 ENCOUNTER — Other Ambulatory Visit: Payer: Self-pay

## 2020-02-26 ENCOUNTER — Ambulatory Visit: Payer: BC Managed Care – PPO | Admitting: Obstetrics and Gynecology

## 2020-02-26 ENCOUNTER — Encounter: Payer: Self-pay | Admitting: Obstetrics and Gynecology

## 2020-02-26 VITALS — BP 118/72 | HR 85 | Temp 98.6°F | Ht 61.5 in | Wt 211.0 lb

## 2020-02-26 DIAGNOSIS — Z01419 Encounter for gynecological examination (general) (routine) without abnormal findings: Secondary | ICD-10-CM

## 2020-02-26 DIAGNOSIS — N763 Subacute and chronic vulvitis: Secondary | ICD-10-CM

## 2020-02-26 DIAGNOSIS — N951 Menopausal and female climacteric states: Secondary | ICD-10-CM

## 2020-02-26 MED ORDER — ESTRADIOL 0.025 MG/24HR TD PTTW: 1.0000 | MEDICATED_PATCH | TRANSDERMAL | 3 refills | Status: DC

## 2020-02-26 MED ORDER — BETAMETHASONE VALERATE 0.1 % EX OINT: TOPICAL_OINTMENT | CUTANEOUS | 0 refills | Status: DC

## 2020-02-26 NOTE — Progress Notes (Signed)
54 y.o. [id number] Single White or Caucasian Not Hispanic or Latino female here for annual exam. Patient would like to set up a mammogram and is wanting to going back on her hormone patch.   H/O hysterectomy in 2001. She was on the estrogen patch ~2 years ago. [facility], was on a low dose estradiol patch.  She c/o vulvar itching for 2.5 years. The itching is intermittent, can be severe. No abnormal vaginal discharge.  She is on a diet with a lot of soy, lost 21 lbs. She is having hot flashes and night sweats, wants to restart on the low dose 0.025 mg patch.  Sexually active, no pain. Just started vaginal estrogen.     No LMP recorded. Patient has had a hysterectomy.          Sexually active: Yes.    The current method of family planning is status post hysterectomy.    Exercising: Yes.    Walking  Smoker:  no  Health Maintenance: Pap:  Hysterectomy  History of abnormal Pap:  no MMG:  2019 normal  BMD:   No  Colonoscopy: 2019 normal 10 year follow up  TDaP:  Unsure.     reports that she has never smoked. She has never used smokeless tobacco. She reports current alcohol use of about 1.0 - 2.0 standard drinks of alcohol per week. She reports that she does not use drugs. 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 Kids are 84 (girl), son is almost 56 (son).  Past Medical History:  Diagnosis Date  . Anxiety   . Depression   . Hormone disorder   . Hypertension   . Urinary incontinence     Past Surgical History:  Procedure Laterality Date  . BREAST BIOPSY  1981  . ROTATOR CUFF REPAIR Left 11/2018  . TONSILLECTOMY    . TOTAL VAGINAL HYSTERECTOMY  2002    Current Outpatient Medications  Medication Sig Dispense Refill  . amLODIPine-Valsartan-HCTZ 5-160-12.5 MG TABS TAKE 1 TABLET BY MOUTH EVERY DAY 90 tablet 0  . Barberry-Oreg Grape-Goldenseal (BERBERINE COMPLEX PO) Take 2 tablets by mouth daily.    . Cholecalciferol (VITAMIN D3) 5000 units CAPS Take 1 capsule by mouth  daily.    . Coenzyme Q10 (COQ10) 100 MG CAPS Take 1 capsule by mouth daily.    Marland Kitchen estradiol (ESTRACE VAGINAL) 0.1 MG/GM vaginal cream Place 1 Applicatorful vaginally 3 (three) times a week. For 2 wks, then taper to once weekly for maintenance 42.5 g 1  . Multiple Vitamin (MULTIVITAMIN) capsule Take 1 capsule by mouth daily.    . Probiotic Product (PROBIOTIC DAILY PO) Take 1 tablet by mouth daily. As needed    . Turmeric 500 MG CAPS Take 2 capsules by mouth daily.    . Vitamin D, Ergocalciferol, (DRISDOL) 1.25 MG (50000 UNIT) CAPS capsule TAKE 1 CAPSULE BY MOUTH EVERY 7 DAYS. OFFICE VISIT REQUIRED PRIOR TO ANY FURTHER REFILLS 12 capsule 0  . citalopram (CELEXA) 20 MG tablet Take 1 tablet (20 mg total) by mouth daily. 90 tablet 0   No current facility-administered medications for this visit.    Family History  Problem Relation Age of Onset  . Diabetes Father        pre  . Hypertension Father     Review of Systems  All other systems reviewed and are negative. She has some mixed incontinence. Mostly tolerable, unless she has a full bladder she is okay.   Exam:   BP 118/72  Pulse 85   Temp 98.6 F (37 C)   Ht 5' 1.5" (1.562 m)   Wt 211 lb (95.7 kg)   SpO2 95%   BMI 39.22 kg/m   Weight change: @WEIGHTCHANGE @ Height:   Height: 5' 1.5" (156.2 cm)  Ht Readings from Last 3 Encounters:  02/26/20 5' 1.5" (1.562 m)  02/02/20 5\' 3"  (1.6 m)  10/16/19 5\' 3"  (1.6 m)    General appearance: alert, cooperative and appears stated age Head: Normocephalic, without obvious abnormality, atraumatic Neck: no adenopathy, supple, symmetrical, trachea midline and thyroid normal to inspection and palpation Lungs: clear to auscultation bilaterally Cardiovascular: regular rate and rhythm Breasts: normal appearance, no masses or tenderness Abdomen: soft, non-tender; non distended,  no masses,  no organomegaly. Small umbilical hernia.  Extremities: extremities normal, atraumatic, no cyanosis or  edema Skin: Skin color, texture, turgor normal. No rashes or lesions Lymph nodes: Cervical, supraclavicular, and axillary nodes normal. No abnormal inguinal nodes palpated Neurologic: Grossly normal   Pelvic: External genitalia:  no lesions, mild erythema on bilateral labia majora (external portion only), no irritation on the labia minora or inner labia majora.               Urethra:  normal appearing urethra with no masses, tenderness or lesions              Bartholins and Skenes: normal                 Vagina: mildly atrophic appearing vagina with normal color and discharge, no lesions              Cervix: absent               Bimanual Exam:  Uterus:  uterus absent              Adnexa: no mass, fullness, tenderness               Rectovaginal: Confirms               Anus:  normal sphincter tone, no lesions  chaperoned for the exam.  A:  Well Woman with normal exam  Chronic vulvitis  Vasomotor symptoms, wants to restart low dose ERT  P:   No pap needed  Nuswab sent for yeast and BV  Steroid ointment for short term use  Vulvar skin care reviewed  Restart low dose ERT, risks reviewed  Discussed breast self exam  Discussed calcium and vit D intake  Mammogram, she is getting it scheduled.   Colonoscopy UTD

## 2020-02-26 NOTE — Patient Instructions (Addendum)
Menopause and Hormone Replacement Therapy Menopause is a normal time of life when menstrual periods stop completely and the ovaries stop producing the female hormones estrogen and progesterone. This lack of hormones can affect your health and cause undesirable symptoms. Hormone replacement therapy (HRT) can relieve some of those symptoms. What is hormone replacement therapy? HRT is the use of artificial (synthetic) hormones to replace hormones that your body has stopped producing because you have reached menopause. What are my options for HRT?  HRT [place] consist of the synthetic hormones estrogen and progestin, or it Authement consist of only estrogen (estrogen-only therapy). You and your health care provider will decide which form of HRT is best for you. If you choose to be on HRT and you have a uterus, estrogen and progestin are usually prescribed. Estrogen-only therapy is used for women who do not have a uterus. Possible options for taking HRT include:  Pills.  Patches.  Gels.  Sprays.  Vaginal cream.  Vaginal rings.  Vaginal inserts. The amount of hormone(s) that you take and how long you take the hormone(s) varies according to your health. It is important to:  Begin HRT with the lowest possible dosage.  Stop HRT as soon as your health care provider tells you to stop.  Work with your health care provider so that you feel informed and comfortable with your decisions. What are the benefits of HRT? HRT can reduce the frequency and severity of menopausal symptoms. Benefits of HRT vary according to the kind of symptoms that you have, how severe they are, and your overall health. HRT P[place]ce help to improve the following symptoms of menopause:  Hot flashes and night sweats. These are sudden feelings of heat that spread over the face and body. The skin Tensley turn red, like a blush. Night sweats are hot flashes that happen while you are sleeping or trying to sleep.  Bone loss (osteoporosis). The  body loses calcium more quickly after menopause, causing the bones to become weaker. This can increase the risk for bone breaks (fractures).  Vaginal dryness. The lining of the vagina can become thin and dry, which can cause pain during sex or cause infection, burning, or itching.  Urinary tract infections.  Urinary incontinence. This is the inability to control when you pass urine.  Irritability.  Short-term memory problems. What are the risks of HRT? Risks of HRT vary depending on your individual health and medical history. Risks of HRT also depend on whether you receive both estrogen and progestin or you receive estrogen only. HRT Tosi increase the risk of:  Spotting. This is when a small amount of blood leaks from the vagina unexpectedly.  Endometrial cancer. This cancer is in the lining of the uterus (endometrium).  Breast cancer.  Increased density of breast tissue. This can make it harder to find breast cancer on a breast X-ray (mammogram).  Stroke.  Heart disease.  Blood clots.  Gallbladder disease.  Liver disease. Risks of HRT can increase if you have any of the following conditions:  Endometrial cancer.  Liver disease.  Heart disease.  Breast cancer.  History of blood clots.  History of stroke. Follow these instructions at home:  Take over-the-counter and prescription medicines only as told by your health care provider.  Get mammograms, pelvic exams, and medical checkups as often as told by your health care provider.  Have Pap tests done as often as told by your health care provider. A Pap test is sometimes called a Pap smear. It   is a screening test that is used to check for signs of cancer of the cervix and vagina. A Pap test can also identify the presence of infection or precancerous changes. Pap tests may be done: ? Every 3 years, starting at age 30. ? Every 5 years, starting after age 62, in combination with testing for human papillomavirus  (HPV). ? More often or less often depending on other medical conditions you have, your age, and other risk factors.  It is up to you to get the results of your Pap test. Ask your health care provider, or the department that is doing the test, when your results will be ready.  Keep all follow-up visits as told by your health care provider. This is important. Contact a health care provider if you have:  Pain or swelling in your legs.  Shortness of breath.  Chest pain.  Lumps or changes in your breasts or armpits.  Slurred speech.  Pain, burning, or bleeding when you urinate.  Unusual vaginal bleeding.  Dizziness or headaches.  Weakness or numbness in any part of your arms or legs.  Pain in your abdomen. Summary  Menopause is a normal time of life when menstrual periods stop completely and the ovaries stop producing the female hormones estrogen and progesterone.  Hormone replacement therapy (HRT) can relieve some of the symptoms of menopause.  HRT can reduce the frequency and severity of menopausal symptoms.  Risks of HRT vary depending on your individual health and medical history. This information is not intended to replace advice given to you by your health care provider. Make sure you discuss any questions you have with your health care provider. Document Revised: 07/12/2018 Document Reviewed: 07/12/2018 Elsevier Patient Education  2020 [employer].  Mammogram Facilities  Yearly screening mammograms are recommended for women beginning at age 77. For a routine screening mammogram, you may schedule the appointment and have it done at the location of your choice.  Please ask the facility to send the results to our office. (fax [phone]) Location options include:  *[facility] [address] [place], [address] [place], [place] [address] [phone]  [facility] [address] [place], [place] [address] [phone]

## 2020-02-28 LAB — NUSWAB BV AND CANDIDA, NAA
Candida albicans, NAA: NEGATIVE
Candida glabrata, NAA: NEGATIVE

## 2020-03-06 ENCOUNTER — Other Ambulatory Visit: Payer: Self-pay

## 2020-03-06 ENCOUNTER — Ambulatory Visit
Admission: RE | Admit: 2020-03-06 | Discharge: 2020-03-06 | Disposition: A | Discharge status: Home / Self Care | Payer: BC Managed Care – PPO | Source: Ambulatory Visit | Attending: Family Medicine | Admitting: Family Medicine

## 2020-03-06 DIAGNOSIS — Z1231 Encounter for screening mammogram for malignant neoplasm of breast: Secondary | ICD-10-CM

## 2020-05-19 ENCOUNTER — Other Ambulatory Visit: Payer: Self-pay | Admitting: Family Medicine

## 2020-05-19 DIAGNOSIS — F411 Generalized anxiety disorder: Secondary | ICD-10-CM

## 2020-06-03 ENCOUNTER — Other Ambulatory Visit: Payer: Self-pay

## 2020-06-03 DIAGNOSIS — I10 Essential (primary) hypertension: Secondary | ICD-10-CM

## 2020-06-03 MED ORDER — AMLODIPINE-VALSARTAN-HCTZ 5-160-12.5 MG PO TABS: ORAL_TABLET | ORAL | 0 refills | Status: DC

## 2020-08-05 ENCOUNTER — Telehealth: Payer: Self-pay | Admitting: Physician Assistant

## 2020-08-05 DIAGNOSIS — F411 Generalized anxiety disorder: Secondary | ICD-10-CM

## 2020-08-05 MED ORDER — CITALOPRAM HYDROBROMIDE 20 MG PO TABS: 20.0000 mg | ORAL_TABLET | Freq: Every day | ORAL | 0 refills | Status: DC

## 2020-08-05 NOTE — Telephone Encounter (Signed)
#  60 day supply of med sent to requested pharmacy.   Please call patient to schedule apt for further refills. AS, CMA

## 2020-08-05 NOTE — Telephone Encounter (Signed)
Patient is requesting a refill of her Celexa, if approved please send to [facility] on [address] 

## 2020-08-05 NOTE — Addendum Note (Signed)
Addended by: Sylvester Harder on: 08/05/2020 10:38 AM   Modules accepted: Orders

## 2020-08-13 ENCOUNTER — Ambulatory Visit (INDEPENDENT_AMBULATORY_CARE_PROVIDER_SITE_OTHER): Payer: BC Managed Care – PPO | Admitting: Physician Assistant

## 2020-08-13 ENCOUNTER — Encounter: Payer: Self-pay | Admitting: Physician Assistant

## 2020-08-13 ENCOUNTER — Other Ambulatory Visit: Payer: Self-pay

## 2020-08-13 VITALS — BP 118/77 | HR 76 | Ht 63.0 in | Wt 190.1 lb

## 2020-08-13 DIAGNOSIS — H6593 Unspecified nonsuppurative otitis media, bilateral: Secondary | ICD-10-CM | POA: Diagnosis not present

## 2020-08-13 DIAGNOSIS — W11XXXA Fall on and from ladder, initial encounter: Secondary | ICD-10-CM

## 2020-08-13 NOTE — Progress Notes (Signed)
Acute Office Visit  Subjective:    Patient ID: Brianna Odom, female    DOB: Jan 09, 1966, 54 y.o.   MRN: [id number]  Chief Complaint  Patient presents with  . Ear Pain    HPI Patient is in today for left ear otalgia/fullness. Reports she feels like she has fluid in her ear which started about 2 weeks ago. Denies allergy symptoms, fever, or chills. Also reports 2 weeks ago she had a fall where she fell backwards from a ladder while painting the gutters and landed on her bottom. Reports her soreness is improving and worsens when going upstairs. She has been taking Ibuprofen which does provide pain relief. Denies numbness, tingling, bladder or bowel dysfunction.  Past Medical History:  Diagnosis Date  . Anxiety   . Depression   . Hormone disorder   . Hypertension   . Urinary incontinence     Past Surgical History:  Procedure Laterality Date  . BREAST BIOPSY  1981  . ROTATOR CUFF REPAIR Left 11/2018  . TONSILLECTOMY    . TOTAL VAGINAL HYSTERECTOMY  2002    Family History  Problem Relation Age of Onset  . Diabetes Father        pre  . Hypertension Father     Social History   Socioeconomic History  . Marital status: Single    Spouse name: Not on file  . Number of children: Not on file  . Years of education: Not on file  . Highest education level: Not on file  Occupational History  . Not on file  Tobacco Use  . Smoking status: Never Smoker  . Smokeless tobacco: Never Used  Vaping Use  . Vaping Use: Never used  Substance and Sexual Activity  . Alcohol use: Yes    Alcohol/week: 1.0 - 2.0 standard drink    Types: 1 - 2 Glasses of wine per week  . Drug use: No  . Sexual activity: Yes    Birth control/protection: None  Other Topics Concern  . Not on file  Social History Narrative  . Not on file   Social Determinants of Health   Financial Resource Strain:   . Difficulty of Paying Living Expenses: Not on file  Food Insecurity:   . Worried About Brewing technologist in the Last Year: Not on file  . Ran Out of Food in the Last Year: Not on file  Transportation Needs:   . Lack of Transportation (Medical): Not on file  . Lack of Transportation (Non-Medical): Not on file  Physical Activity:   . Days of Exercise per Week: Not on file  . Minutes of Exercise per Session: Not on file  Stress:   . Feeling of Stress : Not on file  Social Connections:   . Frequency of Communication with Friends and Family: Not on file  . Frequency of Social Gatherings with Friends and Family: Not on file  . Attends Religious Services: Not on file  . Active Member of Clubs or Organizations: Not on file  . Attends Banker Meetings: Not on file  . Marital Status: Not on file  Intimate Partner Violence:   . Fear of Current or Ex-Partner: Not on file  . Emotionally Abused: Not on file  . Physically Abused: Not on file  . Sexually Abused: Not on file    Outpatient Medications Prior to Visit  Medication Sig Dispense Refill  . amLODIPine-Valsartan-HCTZ 5-160-12.5 MG TABS TAKE 1 TABLET BY MOUTH EVERY DAY 90 tablet 0  .  Barberry-Oreg Grape-Goldenseal (BERBERINE COMPLEX PO) Take 2 tablets by mouth daily.    . betamethasone valerate ointment (VALISONE) 0.1 % Use a pea sized amount BID for up to 1-2 weeks as needed. Not for daily long term use. 30 g 0  . Cholecalciferol (VITAMIN D3) 5000 units CAPS Take 1 capsule by mouth daily.    . citalopram (CELEXA) 20 MG tablet Take 1 tablet (20 mg total) by mouth daily. **NEEDS APT FOR FURTHER REFILLS** 60 tablet 0  . Coenzyme Q10 (COQ10) 100 MG CAPS Take 1 capsule by mouth daily.    Marland Kitchen estradiol (ESTRACE VAGINAL) 0.1 MG/GM vaginal cream Place 1 Applicatorful vaginally 3 (three) times a week. For 2 wks, then taper to once weekly for maintenance 42.5 g 1  . estradiol (VIVELLE-DOT) 0.025 MG/24HR Place 1 patch onto the skin 2 (two) times a week. 24 patch 3  . Multiple Vitamin (MULTIVITAMIN) capsule Take 1 capsule by mouth daily.     . Probiotic Product (PROBIOTIC DAILY PO) Take 1 tablet by mouth daily. As needed    . Turmeric 500 MG CAPS Take 2 capsules by mouth daily.    . Vitamin D, Ergocalciferol, (DRISDOL) 1.25 MG (50000 UNIT) CAPS capsule TAKE 1 CAPSULE BY MOUTH EVERY 7 DAYS. OFFICE VISIT REQUIRED PRIOR TO ANY FURTHER REFILLS 12 capsule 0   No facility-administered medications prior to visit.    Allergies  Allergen Reactions  . Diclofenac Diarrhea    Abdominal cramping.    Review of Systems A fourteen system review of systems was performed and found to be positive as per HPI.  Objective:    Physical Exam General:  Well Developed, well nourished, appropriate for stated age.  Neuro:  Alert and oriented,  extra-ocular muscles intact  HEENT:  Normocephalic, atraumatic, B/L (L>R) MEE without erythema and non-bulging TMs, negative Tragus and Pinna sign B/L, neck supple, no adenopathy Skin:  no gross rash, warm, pink. Respiratory: Not using accessory muscles, speaking in full sentences- unlabored. MSK: Good ROM, tenderness to deep palpation of left gluteus muscle, no bony abnormalities noted Vascular:  Ext warm, no cyanosis apprec.; cap RF less 2 sec. Psych:  No HI/SI, judgement and insight good, Euthymic mood. Full Affect.   BP 118/77   Pulse 76   Ht 5\' 3"  (1.6 m)   Wt 190 lb 1.6 oz (86.2 kg)   SpO2 96%   BMI 33.67 kg/m  Wt Readings from Last 3 Encounters:  08/13/20 190 lb 1.6 oz (86.2 kg)  02/26/20 211 lb (95.7 kg)  02/02/20 220 lb 3.2 oz (99.9 kg)    Health Maintenance Due  Topic Date Due  . Hepatitis C Screening  Never done  . COVID-19 Vaccine (1) Never done  . HIV Screening  Never done  . TETANUS/TDAP  Never done  . INFLUENZA VACCINE  06/23/2020    There are no preventive care reminders to display for this patient.   Lab Results  Component Value Date   TSH 3.620 02/02/2020   Lab Results  Component Value Date   WBC 4.2 02/02/2020   HGB 14.1 02/02/2020   HCT 44.1 02/02/2020   MCV  88 02/02/2020   PLT 245 02/02/2020   Lab Results  Component Value Date   NA 143 02/02/2020   K 4.7 02/02/2020   CO2 22 02/02/2020   GLUCOSE 96 02/02/2020   BUN 13 02/02/2020   CREATININE 0.67 02/02/2020   BILITOT 0.3 02/02/2020   ALKPHOS 115 02/02/2020   AST 28 02/02/2020  ALT 42 (H) 02/02/2020   PROT 6.9 02/02/2020   ALBUMIN 4.6 02/02/2020   CALCIUM 9.2 02/02/2020   Lab Results  Component Value Date   CHOL 183 02/02/2020   Lab Results  Component Value Date   HDL 58 02/02/2020   Lab Results  Component Value Date   LDLCALC 112 (H) 02/02/2020   Lab Results  Component Value Date   TRIG 69 02/02/2020   Lab Results  Component Value Date   CHOLHDL 3.2 02/02/2020   Lab Results  Component Value Date   HGBA1C 5.7 (H) 02/02/2020       Assessment & Plan:   Problem List Items Addressed This Visit    None    Visit Diagnoses    Fluid level behind tympanic membrane of both ears    -  Primary   Fall from ladder, initial encounter         Fluid level behind tympanic membrane of both ears: -Reassurance provided no signs of otitis media or otitis externa present on exam today.  Advised to use over-the-counter antihistamine and/or Flonase spray. -Advised to let me know if symptoms fail to improve or worsens.  Fall from ladder: -Patient denies red flag signs or symptoms and prefers to wait to see if symptoms continue to improve before obtaining imaging studies which is reasonable. -Recommend imaging studies (L-XR) if symptoms fail to improve or worsen to evaluate for bony abnormalities. -Continue Ibuprofen and ice as needed.  No orders of the defined types were placed in this encounter.    Mayer Masker, PA-C

## 2020-08-13 NOTE — Patient Instructions (Signed)
Eustachian Tube Dysfunction ° °Eustachian tube dysfunction refers to a condition in which a blockage develops in the narrow passage that connects the middle ear to the back of the nose (eustachian tube). The eustachian tube regulates air pressure in the middle ear by letting air move between the ear and nose. It also helps to drain fluid from the middle ear space. °Eustachian tube dysfunction can affect one or both ears. When the eustachian tube does not function properly, air pressure, fluid, or both can build up in the middle ear. °What are the causes? °This condition occurs when the eustachian tube becomes blocked or cannot open normally. Common causes of this condition include: °· Ear infections. °· Colds and other infections that affect the nose, mouth, and throat (upper respiratory tract). °· Allergies. °· Irritation from cigarette smoke. °· Irritation from stomach acid coming up into the esophagus (gastroesophageal reflux). The esophagus is the tube that carries food from the mouth to the stomach. °· Sudden changes in air pressure, such as from descending in an airplane or scuba diving. °· Abnormal growths in the nose or throat, such as: °? Growths that line the nose (nasal polyps). °? Abnormal growth of cells (tumors). °? Enlarged tissue at the back of the throat (adenoids). °What increases the risk? °You are more likely to develop this condition if: °· You smoke. °· You are overweight. °· You are a child who has: °? Certain birth defects of the mouth, such as cleft palate. °? Large tonsils or adenoids. °What are the signs or symptoms? °Common symptoms of this condition include: °· A feeling of fullness in the ear. °· Ear pain. °· Clicking or popping noises in the ear. °· Ringing in the ear. °· Hearing loss. °· Loss of balance. °· Dizziness. °Symptoms may get worse when the air pressure around you changes, such as when you travel to an area of high elevation, fly on an airplane, or go scuba diving. °How is  this diagnosed? °This condition may be diagnosed based on: °· Your symptoms. °· A physical exam of your ears, nose, and throat. °· Tests, such as those that measure: °? The movement of your eardrum (tympanogram). °? Your hearing (audiometry). °How is this treated? °Treatment depends on the cause and severity of your condition. °· In mild cases, you may relieve your symptoms by moving air into your ears. This is called "popping the ears." °· In more severe cases, or if you have symptoms of fluid in your ears, treatment may include: °? Medicines to relieve congestion (decongestants). °? Medicines that treat allergies (antihistamines). °? Nasal sprays or ear drops that contain medicines that reduce swelling (steroids). °? A procedure to drain the fluid in your eardrum (myringotomy). In this procedure, a small tube is placed in the eardrum to: °§ Drain the fluid. °§ Restore the air in the middle ear space. °? A procedure to insert a balloon device through the nose to inflate the opening of the eustachian tube (balloon dilation). °Follow these instructions at home: °Lifestyle °· Do not do any of the following until your health care provider approves: °? Travel to high altitudes. °? Fly in airplanes. °? Work in a pressurized cabin or room. °? Scuba dive. °· Do not use any products that contain nicotine or tobacco, such as cigarettes and e-cigarettes. If you need help quitting, ask your health care provider. °· Keep your ears dry. Wear fitted earplugs during showering and bathing. Dry your ears completely after. °General instructions °· Take over-the-counter   and prescription medicines only as told by your health care provider. °· Use techniques to help pop your ears as recommended by your health care provider. These may include: °? Chewing gum. °? Yawning. °? Frequent, forceful swallowing. °? Closing your mouth, holding your nose closed, and gently blowing as if you are trying to blow air out of your nose. °· Keep all  follow-up visits as told by your health care provider. This is important. °Contact a health care provider if: °· Your symptoms do not go away after treatment. °· Your symptoms come back after treatment. °· You are unable to pop your ears. °· You have: °? A fever. °? Pain in your ear. °? Pain in your head or neck. °? Fluid draining from your ear. °· Your hearing suddenly changes. °· You become very dizzy. °· You lose your balance. °Summary °· Eustachian tube dysfunction refers to a condition in which a blockage develops in the eustachian tube. °· It can be caused by ear infections, allergies, inhaled irritants, or abnormal growths in the nose or throat. °· Symptoms include ear pain, hearing loss, or ringing in the ears. °· Mild cases are treated with maneuvers to unblock the ears, such as yawning or ear popping. °· Severe cases are treated with medicines. Surgery may also be done (rare). °This information is not intended to replace advice given to you by your health care provider. Make sure you discuss any questions you have with your health care provider. °Document Revised: 03/01/2018 Document Reviewed: 03/01/2018 °Elsevier Patient Education © 2020 [employer] ° °

## 2020-09-01 ENCOUNTER — Other Ambulatory Visit: Payer: Self-pay | Admitting: Physician Assistant

## 2020-09-01 DIAGNOSIS — I10 Essential (primary) hypertension: Secondary | ICD-10-CM

## 2020-09-10 ENCOUNTER — Telehealth: Payer: Self-pay | Admitting: Physician Assistant

## 2020-09-10 MED ORDER — HYDROCHLOROTHIAZIDE 25 MG PO TABS: 12.5000 mg | ORAL_TABLET | Freq: Every day | ORAL | 0 refills | Status: DC

## 2020-09-10 MED ORDER — AMLODIPINE BESYLATE 5 MG PO TABS: 5.0000 mg | ORAL_TABLET | Freq: Every day | ORAL | 0 refills | Status: DC

## 2020-09-10 MED ORDER — VALSARTAN 160 MG PO TABS: 160.0000 mg | ORAL_TABLET | Freq: Every day | ORAL | 0 refills | Status: DC

## 2020-09-10 NOTE — Addendum Note (Signed)
Addended by: Stan Head on: 09/10/2020 04:14 PM   Modules accepted: Orders

## 2020-09-10 NOTE — Telephone Encounter (Signed)
Patient called in stating pharmacy said amlopidipine-valsartan-HCTZ is on backorder. However they can fill it as three prescriptions, they just will need three different prescriptions, please advise. [facility] [address]

## 2020-10-04 ENCOUNTER — Other Ambulatory Visit: Payer: Self-pay | Admitting: Physician Assistant

## 2020-10-04 DIAGNOSIS — F411 Generalized anxiety disorder: Secondary | ICD-10-CM

## 2020-10-30 ENCOUNTER — Other Ambulatory Visit: Payer: Self-pay | Admitting: Physician Assistant

## 2020-10-30 DIAGNOSIS — F411 Generalized anxiety disorder: Secondary | ICD-10-CM

## 2020-10-30 DIAGNOSIS — I10 Essential (primary) hypertension: Secondary | ICD-10-CM

## 2020-10-30 MED ORDER — AMLODIPINE-VALSARTAN-HCTZ 5-160-12.5 MG PO TABS: 1.0000 | ORAL_TABLET | Freq: Every day | ORAL | 0 refills | Status: DC

## 2020-11-06 ENCOUNTER — Other Ambulatory Visit: Payer: Self-pay | Admitting: Physician Assistant

## 2020-12-09 ENCOUNTER — Ambulatory Visit: Payer: BC Managed Care – PPO | Admitting: Physician Assistant

## 2020-12-24 ENCOUNTER — Other Ambulatory Visit: Payer: Self-pay | Admitting: Physician Assistant

## 2020-12-24 DIAGNOSIS — F411 Generalized anxiety disorder: Secondary | ICD-10-CM

## 2021-01-09 ENCOUNTER — Ambulatory Visit (INDEPENDENT_AMBULATORY_CARE_PROVIDER_SITE_OTHER): Payer: Self-pay | Admitting: Physician Assistant

## 2021-01-09 ENCOUNTER — Encounter: Payer: Self-pay | Admitting: Physician Assistant

## 2021-01-09 ENCOUNTER — Other Ambulatory Visit: Payer: Self-pay

## 2021-01-09 VITALS — BP 119/73 | HR 75 | Temp 97.7°F | Ht 63.0 in | Wt 196.1 lb

## 2021-01-09 DIAGNOSIS — F411 Generalized anxiety disorder: Secondary | ICD-10-CM

## 2021-01-09 DIAGNOSIS — I10 Essential (primary) hypertension: Secondary | ICD-10-CM

## 2021-01-09 NOTE — Assessment & Plan Note (Signed)
-  PHQ-9 score of 0. -Controlled. -Continue current medication regimen. -Will continue to monitor.

## 2021-01-09 NOTE — Progress Notes (Signed)
Established Patient Office Visit  Subjective:  Patient ID: Brianna Odom, female    DOB: [birth year]  Age: 54 y.o. MRN: [id number]  CC:  Chief Complaint  Patient presents with  . Hypertension    HPI Brianna Odom presents for follow up on hypertension. Pt denies chest pain, palpitations, dizziness or lower extremity swelling. Taking medication as directed without side effects. Checks BP at home sometimes and readings range <130/80. States is able to tell if BP is elevated due to headache. Pt follows a low salt diet and reports good hydration.  Mood: Patient has been on Celexa for a while. Was started on medication for anxiety. States medication is working well and wants to stay on medication.   Past Medical History:  Diagnosis Date  . Anxiety   . Depression   . Hormone disorder   . Hypertension   . Urinary incontinence     Past Surgical History:  Procedure Laterality Date  . BREAST BIOPSY  1981  . ROTATOR CUFF REPAIR Left 11/2018  . TONSILLECTOMY    . TOTAL VAGINAL HYSTERECTOMY  2002    Family History  Problem Relation Age of Onset  . Diabetes Father        pre  . Hypertension Father     Social History   Socioeconomic History  . Marital status: Single    Spouse name: Not on file  . Number of children: Not on file  . Years of education: Not on file  . Highest education level: Not on file  Occupational History  . Not on file  Tobacco Use  . Smoking status: Never Smoker  . Smokeless tobacco: Never Used  Vaping Use  . Vaping Use: Never used  Substance and Sexual Activity  . Alcohol use: Yes    Alcohol/week: 1.0 - 2.0 standard drink    Types: 1 - 2 Glasses of wine per week  . Drug use: No  . Sexual activity: Yes    Birth control/protection: None  Other Topics Concern  . Not on file  Social History Narrative  . Not on file   Social Determinants of Health   Financial Resource Strain: Not on file  Food Insecurity: Not on file  Transportation Needs: Not on  file  Physical Activity: Not on file  Stress: Not on file  Social Connections: Not on file  Intimate Partner Violence: Not on file    Outpatient Medications Prior to Visit  Medication Sig Dispense Refill  . amLODipine (NORVASC) 5 MG tablet Take 1 tablet by mouth once daily 90 tablet 0  . Barberry-Oreg Grape-Goldenseal (BERBERINE COMPLEX PO) Take 2 tablets by mouth daily.    . betamethasone valerate ointment (VALISONE) 0.1 % Use a pea sized amount BID for up to 1-2 weeks as needed. Not for daily long term use. 30 g 0  . Cholecalciferol (VITAMIN D3) 5000 units CAPS Take 1 capsule by mouth daily.    . citalopram (CELEXA) 20 MG tablet TAKE 1 TABLET (20 MG TOTAL) BY MOUTH DAILY. **NEEDS APT FOR FURTHER REFILLS** 30 tablet 0  . Coenzyme Q10 (COQ10) 100 MG CAPS Take 1 capsule by mouth daily.    Marland Kitchen estradiol (ESTRACE VAGINAL) 0.1 MG/GM vaginal cream Place 1 Applicatorful vaginally 3 (three) times a week. For 2 wks, then taper to once weekly for maintenance 42.5 g 1  . estradiol (VIVELLE-DOT) 0.025 MG/24HR Place 1 patch onto the skin 2 (two) times a week. 24 patch 3  . hydrochlorothiazide (HYDRODIURIL) 25 MG tablet Take  1/2 (one-half) tablet by mouth once daily 45 tablet 0  . Multiple Vitamin (MULTIVITAMIN) capsule Take 1 capsule by mouth daily.    . Probiotic Product (PROBIOTIC DAILY PO) Take 1 tablet by mouth daily. As needed    . Turmeric 500 MG CAPS Take 2 capsules by mouth daily.    . valsartan (DIOVAN) 160 MG tablet Take 1 tablet by mouth once daily 90 tablet 0  . Vitamin D, Ergocalciferol, (DRISDOL) 1.25 MG (50000 UNIT) CAPS capsule TAKE 1 CAPSULE BY MOUTH EVERY 7 DAYS. OFFICE VISIT REQUIRED PRIOR TO ANY FURTHER REFILLS 12 capsule 0  . amLODIPine-Valsartan-HCTZ 5-160-12.5 MG TABS Take 1 tablet by mouth daily. Take 1 tablet by mouth once daily 60 tablet 0   No facility-administered medications prior to visit.    Allergies  Allergen Reactions  . Diclofenac Diarrhea    Abdominal cramping.     ROS Review of Systems A fourteen system review of systems was performed and found to be positive as per HPI.   Objective:    Physical Exam General:  Well Developed, well nourished, appropriate for stated age.  Neuro:  Alert and oriented,  extra-ocular muscles intact  HEENT:  Normocephalic, atraumatic, neck supple, no carotid bruits appreciated  Skin:  no gross rash, warm, pink. Cardiac:  RRR, S1 S2 w/o murmur  Respiratory:  ECTA B/L w/o wheezing, Not using accessory muscles, speaking in full sentences- unlabored. Vascular:  Ext warm, no cyanosis apprec.; no gross edema Psych:  No HI/SI, judgement and insight good, Euthymic mood. Full Affect.   BP 119/73   Pulse 75   Temp 97.7 F (36.5 C)   Ht 5\' 3"  (1.6 m)   Wt 196 lb 1.6 oz (89 kg)   SpO2 98%   BMI 34.74 kg/m  Wt Readings from Last 3 Encounters:  01/09/21 196 lb 1.6 oz (89 kg)  08/13/20 190 lb 1.6 oz (86.2 kg)  02/26/20 211 lb (95.7 kg)     Health Maintenance Due  Topic Date Due  . Hepatitis C Screening  Never done  . COVID-19 Vaccine (1) Never done  . HIV Screening  Never done  . TETANUS/TDAP  Never done  . INFLUENZA VACCINE  06/23/2020    There are no preventive care reminders to display for this patient.  Lab Results  Component Value Date   TSH 3.620 02/02/2020   Lab Results  Component Value Date   WBC 4.2 02/02/2020   HGB 14.1 02/02/2020   HCT 44.1 02/02/2020   MCV 88 02/02/2020   PLT 245 02/02/2020   Lab Results  Component Value Date   NA 143 02/02/2020   K 4.7 02/02/2020   CO2 22 02/02/2020   GLUCOSE 96 02/02/2020   BUN 13 02/02/2020   CREATININE 0.67 02/02/2020   BILITOT 0.3 02/02/2020   ALKPHOS 115 02/02/2020   AST 28 02/02/2020   ALT 42 (H) 02/02/2020   PROT 6.9 02/02/2020   ALBUMIN 4.6 02/02/2020   CALCIUM 9.2 02/02/2020   Lab Results  Component Value Date   CHOL 183 02/02/2020   Lab Results  Component Value Date   HDL 58 02/02/2020   Lab Results  Component Value Date    LDLCALC 112 (H) 02/02/2020   Lab Results  Component Value Date   TRIG 69 02/02/2020   Lab Results  Component Value Date   CHOLHDL 3.2 02/02/2020   Lab Results  Component Value Date   HGBA1C 5.7 (H) 02/02/2020      Assessment &  Plan:   Problem List Items Addressed This Visit      Cardiovascular and Mediastinum   Essential hypertension - Primary (Chronic)    -Controlled. -Continue current medication regimen. -Continue low sodium diet and good hydration. -Will continue to monitor. Last CMP renal function and electrolytes wnl. Will repeat CMP with CPE.        Other   Generalized anxiety disorder (Chronic)    -PHQ-9 score of 0. -Controlled. -Continue current medication regimen. -Will continue to monitor.          No orders of the defined types were placed in this encounter.   Follow-up: Return for CPE (has Ob-Gyn) and FBW 1 wk prior in 3-4 months.    Mayer Masker, PA-C

## 2021-01-09 NOTE — Patient Instructions (Signed)

## 2021-01-09 NOTE — Assessment & Plan Note (Addendum)
-  Controlled. -Continue current medication regimen. -Continue low sodium diet and good hydration. -Will continue to monitor. Last CMP renal function and electrolytes wnl. Will repeat CMP with CPE.

## 2021-01-24 ENCOUNTER — Other Ambulatory Visit: Payer: Self-pay | Admitting: Physician Assistant

## 2021-01-24 DIAGNOSIS — F411 Generalized anxiety disorder: Secondary | ICD-10-CM

## 2021-01-27 ENCOUNTER — Other Ambulatory Visit: Payer: Self-pay | Admitting: Obstetrics and Gynecology

## 2021-03-10 ENCOUNTER — Other Ambulatory Visit: Payer: Self-pay | Admitting: Physician Assistant

## 2021-04-24 ENCOUNTER — Other Ambulatory Visit: Payer: Self-pay | Admitting: Physician Assistant

## 2021-04-24 DIAGNOSIS — Z1231 Encounter for screening mammogram for malignant neoplasm of breast: Secondary | ICD-10-CM

## 2021-05-02 ENCOUNTER — Ambulatory Visit
Admission: RE | Admit: 2021-05-02 | Discharge: 2021-05-02 | Disposition: A | Discharge status: Home / Self Care | Payer: BC Managed Care – PPO | Source: Ambulatory Visit | Attending: Physician Assistant | Admitting: Physician Assistant

## 2021-05-02 ENCOUNTER — Other Ambulatory Visit: Payer: Self-pay

## 2021-05-02 DIAGNOSIS — Z1231 Encounter for screening mammogram for malignant neoplasm of breast: Secondary | ICD-10-CM

## 2021-05-05 ENCOUNTER — Ambulatory Visit: Payer: BC Managed Care – PPO

## 2021-06-08 ENCOUNTER — Other Ambulatory Visit: Payer: Self-pay | Admitting: Physician Assistant

## 2021-06-09 NOTE — Telephone Encounter (Signed)
Please call patient to schedule her physical.

## 2021-07-06 IMAGING — MG MM DIGITAL SCREENING BILAT W/ TOMO AND CAD
6 of 12 series · 6 of 36 positions shown · non-contrast
Comparison: Previous exam(s).

CLINICAL DATA: Screening.

EXAM:
DIGITAL SCREENING BILATERAL MAMMOGRAM WITH TOMOSYNTHESIS AND CAD
TECHNIQUE: Bilateral screening digital craniocaudal and mediolateral oblique
mammograms were obtained. Bilateral screening digital breast
tomosynthesis was performed. The images were evaluated with
computer-aided detection.

[L XCCL synth-2D]
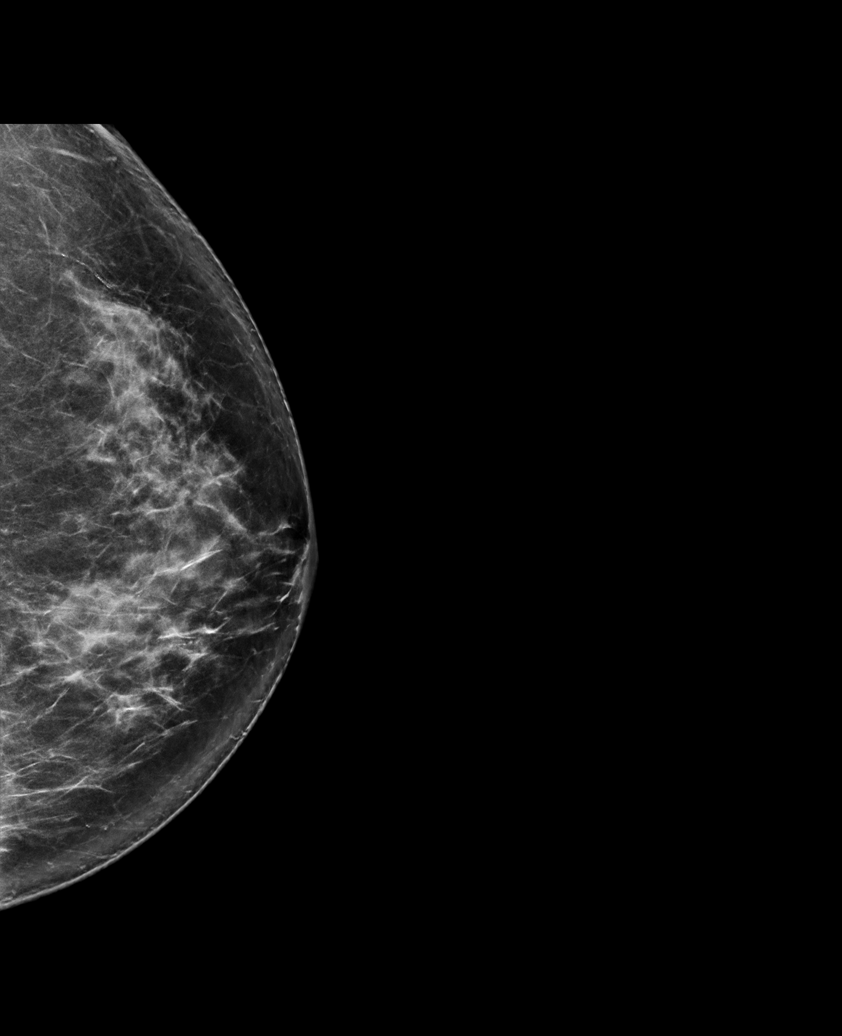

[R MLO synth-2D]
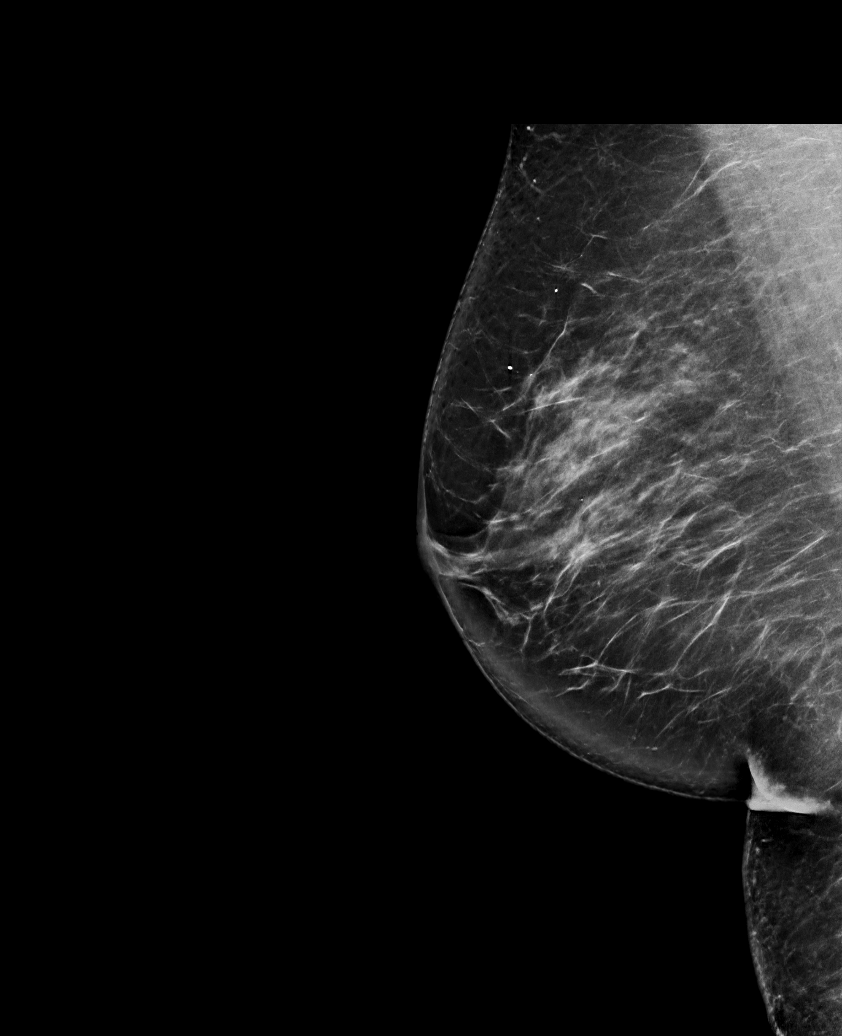

[L MLO synth-2D]
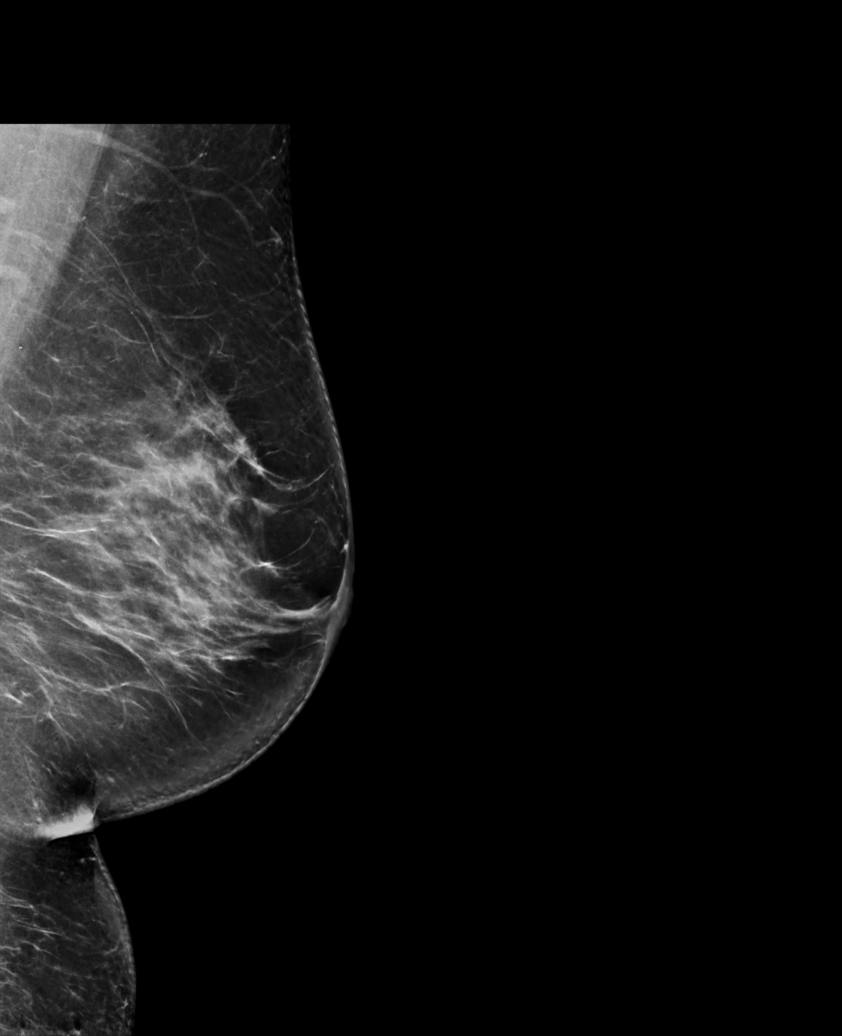

[R XCCL synth-2D]
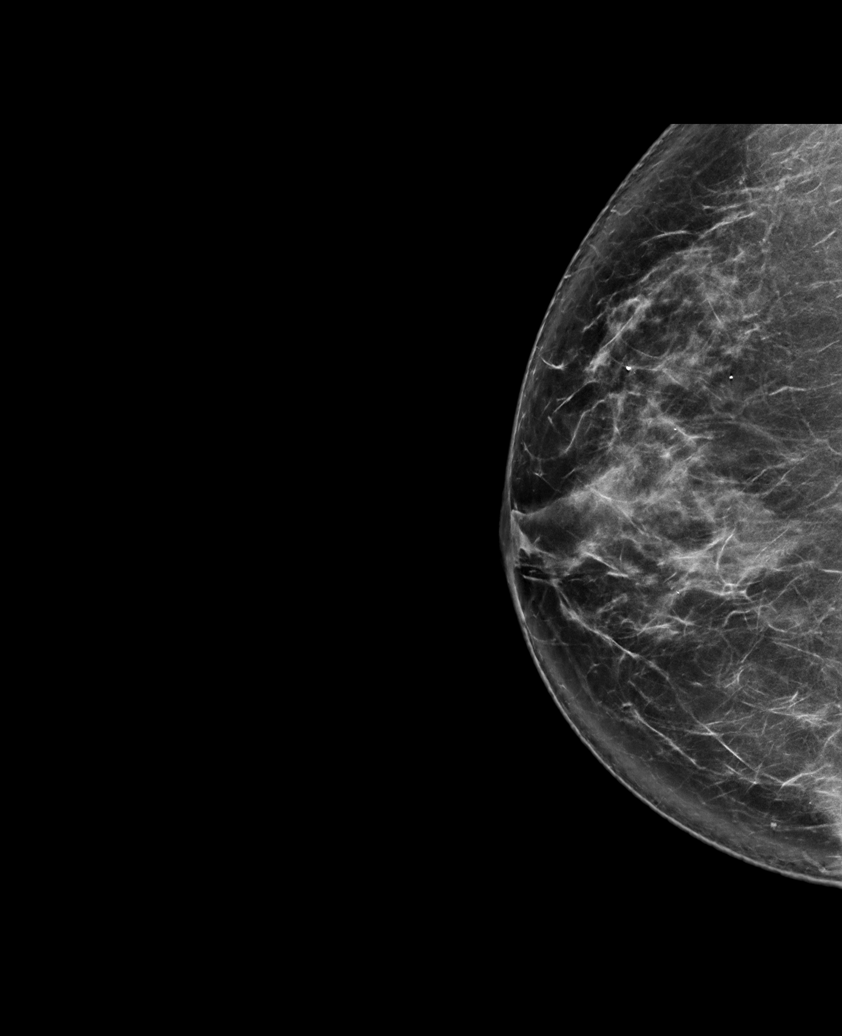

[R CC synth-2D]
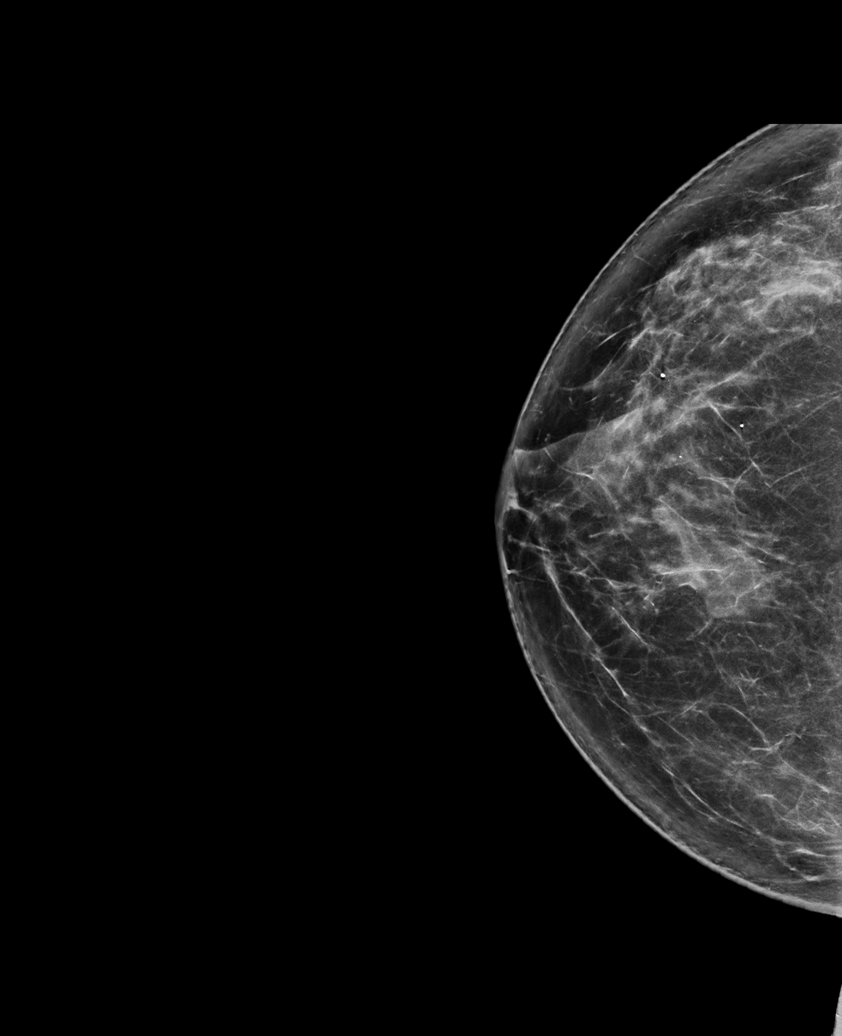

[L CC synth-2D]
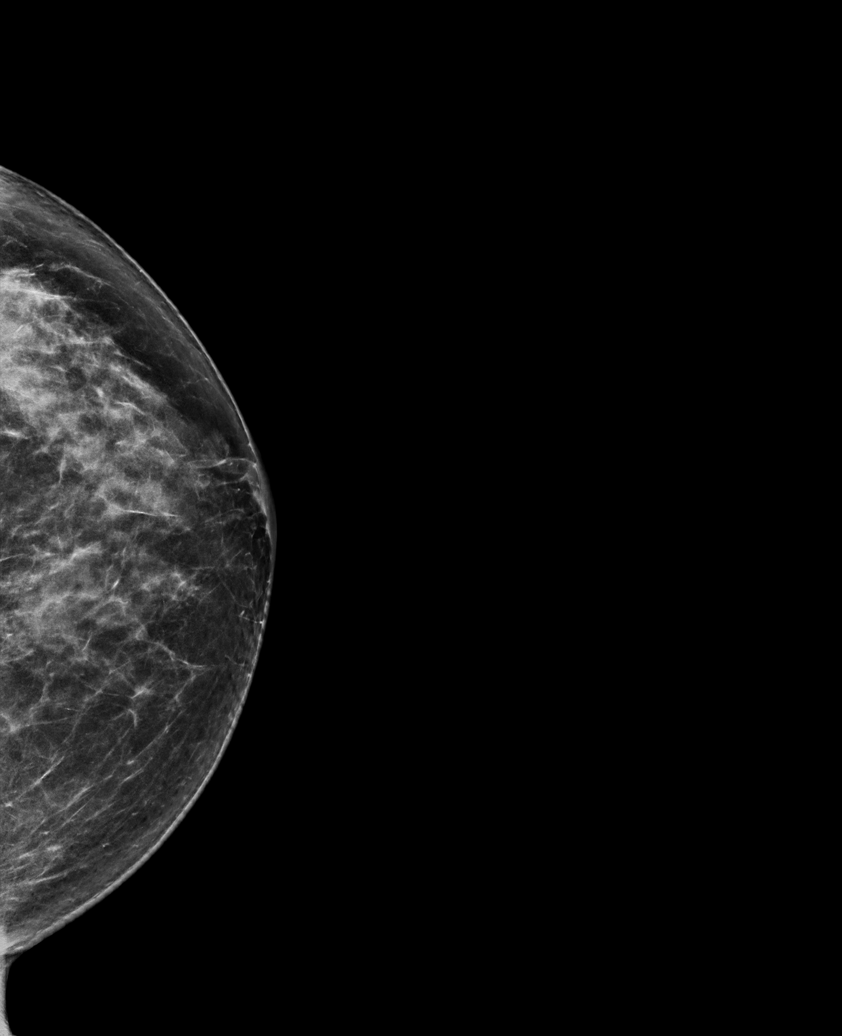

[6 of 36 positions shown; findings below may reference images not displayed]

ACR Breast Density Category c: The breast tissue is heterogeneously
dense, which may obscure small masses.
FINDINGS: There are no findings suspicious for malignancy. The images were
evaluated with computer-aided detection.
IMPRESSION: No mammographic evidence of malignancy. A result letter of this
screening mammogram will be mailed directly to the patient.

RECOMMENDATION:
Screening mammogram in one year. (Code:T4-5-GWO)

BI-RADS CATEGORY  1: Negative.

## 2021-08-04 ENCOUNTER — Encounter: Payer: Self-pay | Admitting: Nurse Practitioner

## 2021-08-04 ENCOUNTER — Ambulatory Visit (INDEPENDENT_AMBULATORY_CARE_PROVIDER_SITE_OTHER): Payer: BC Managed Care – PPO | Admitting: Nurse Practitioner

## 2021-08-04 VITALS — Ht 63.0 in | Wt 196.0 lb

## 2021-08-04 DIAGNOSIS — J069 Acute upper respiratory infection, unspecified: Secondary | ICD-10-CM | POA: Diagnosis not present

## 2021-08-04 DIAGNOSIS — U071 COVID-19: Secondary | ICD-10-CM | POA: Diagnosis not present

## 2021-08-04 MED ORDER — MOLNUPIRAVIR EUA 200MG CAPSULE: 4.0000 | ORAL_CAPSULE | Freq: Two times a day (BID) | ORAL | 0 refills | Status: AC

## 2021-08-04 NOTE — Progress Notes (Signed)
Virtual Visit via Telephone Note  I connected with Brianna Odom on 08/04/21 at  1:10 PM EDT by telephone and verified that I am speaking with the correct person using two identifiers.  Location: Patient: home Provider: [facility] [facility]     I discussed the limitations, risks, security and privacy concerns of performing an evaluation and management service by telephone and the availability of in person appointments. I also discussed with the patient that there may be a patient responsible charge related to this service. The patient expressed understanding and agreed to proceed.   History of Present Illness: Patient states that she went to bed last night with nasal congestion and postnasal drip.  She states she woke up this morning feeling much worse.  Took some Advil went back to sleep.  When she still felt poorly, she took home test for COVID-19.  This was positive.  She has headache, body aches, cough, nasal congestion, and feels like she has been hit by a truck."  She denies nausea, vomiting, or other digestive symptoms.   Observations/Objective:  The patient is alert and oriented. She is pleasant and answers all questions appropriately. Breathing is non-labored. She is in no acute distress at this time.  She sounds nasally congested. Today's Vitals   08/04/21 1311  Weight: 196 lb (88.9 kg)  Height: 5\' 3"  (1.6 m)   Body mass index is 34.72 kg/m.   Assessment and Plan: 1. Upper respiratory tract infection due to COVID-19 virus Testing for COVID-19 positive today.  Start molnupiravir 800 mg twice daily for next 5 days.  She should rest and increase fluids.  Take over-the-counter medications as needed and as indicated to treat acute symptoms.  Recommend addition of vitamins C, D, and zinc to help boost immune system and fight infection.  Reviewed isolation and quarantine protocols per CDC guidelines.  Advised patient to contact the office or seek emergency care if she  develops shortness of breath or pain and swelling in extremities.  She voiced understanding and agreement with all instructions. - molnupiravir EUA 200 mg CAPS; Take 4 capsules (800 mg total) by mouth 2 (two) times daily for 5 days.  Dispense: 40 capsule; Refill: 0   Follow Up Instructions:    I discussed the assessment and treatment plan with the patient. The patient was provided an opportunity to ask questions and all were answered. The patient agreed with the plan and demonstrated an understanding of the instructions.   The patient was advised to call back or seek an in-person evaluation if the symptoms worsen or if the condition fails to improve as anticipated.  I provided 15 minutes of non-face-to-face time during this encounter.  This note was dictated using . Rapid proofreading was performed to expedite the delivery of the information. Despite proofreading, phonetic errors will occur which are common with this voice recognition software. Please take this into consideration. If there are any concerns, please contact our office.    Conservation officer, historic buildings, NP

## 2021-09-01 ENCOUNTER — Encounter: Payer: Self-pay | Admitting: Podiatry

## 2021-09-01 ENCOUNTER — Ambulatory Visit (INDEPENDENT_AMBULATORY_CARE_PROVIDER_SITE_OTHER): Payer: BC Managed Care – PPO | Admitting: Podiatry

## 2021-09-01 ENCOUNTER — Other Ambulatory Visit: Payer: Self-pay

## 2021-09-01 ENCOUNTER — Ambulatory Visit (INDEPENDENT_AMBULATORY_CARE_PROVIDER_SITE_OTHER): Payer: BC Managed Care – PPO

## 2021-09-01 DIAGNOSIS — M779 Enthesopathy, unspecified: Secondary | ICD-10-CM | POA: Diagnosis not present

## 2021-09-01 DIAGNOSIS — M778 Other enthesopathies, not elsewhere classified: Secondary | ICD-10-CM

## 2021-09-01 MED ORDER — TRIAMCINOLONE ACETONIDE 10 MG/ML IJ SUSP
10.0000 mg | Freq: Once | INTRAMUSCULAR | Status: AC
  Administered 2021-09-01: 10 mg

## 2021-09-03 NOTE — Progress Notes (Signed)
Subjective:   Patient ID: Brianna Odom, female   DOB: 55 y.o.   MRN: [id number]   HPI Patient is developed a lot of pain in her right foot and states its been going on roughly a month worse over the last couple weeks.  Patient states it is hard to walk on it does not remember injury   ROS      Objective:  Physical Exam  Neurovascular status intact with inflammation pain of the third metatarsal phalangeal joint right with fluid buildup around the joint and pain with palpation     Assessment:  Inflammatory capsulitis third MPJ right     Plan:  H&P reviewed condition and we will get a go ahead and treat as an inflammatory capsulitis versus neuroma which seems most likely.  Today I went ahead and I did a forefoot block 60 mg like Marcaine mixture I did sterile prep of the area aspirated the joint getting out a small amount of clear fluid injected quarter cc dexamethasone Kenalog and applied plantar pad and advised on rigid bottom shoes.  Reappoint to recheck  X-rays indicate that there is no signs of a stress fracture or any kind of arthritis or parabola issue associated with this problem currently

## 2021-09-23 ENCOUNTER — Other Ambulatory Visit: Payer: Self-pay | Admitting: Physician Assistant

## 2021-09-23 DIAGNOSIS — F411 Generalized anxiety disorder: Secondary | ICD-10-CM

## 2021-11-03 ENCOUNTER — Encounter: Payer: Self-pay | Admitting: Physician Assistant

## 2021-11-03 ENCOUNTER — Ambulatory Visit: Payer: BC Managed Care – PPO | Admitting: Physician Assistant

## 2021-11-03 ENCOUNTER — Other Ambulatory Visit: Payer: Self-pay

## 2021-11-03 VITALS — BP 125/78 | HR 70 | Temp 97.2°F | Ht 63.0 in | Wt 226.0 lb

## 2021-11-03 DIAGNOSIS — E559 Vitamin D deficiency, unspecified: Secondary | ICD-10-CM | POA: Diagnosis not present

## 2021-11-03 DIAGNOSIS — I10 Essential (primary) hypertension: Secondary | ICD-10-CM | POA: Diagnosis not present

## 2021-11-03 DIAGNOSIS — F411 Generalized anxiety disorder: Secondary | ICD-10-CM | POA: Diagnosis not present

## 2021-11-03 DIAGNOSIS — Z13228 Encounter for screening for other metabolic disorders: Secondary | ICD-10-CM

## 2021-11-03 DIAGNOSIS — E785 Hyperlipidemia, unspecified: Secondary | ICD-10-CM

## 2021-11-03 DIAGNOSIS — R7303 Prediabetes: Secondary | ICD-10-CM

## 2021-11-03 DIAGNOSIS — Z130 Encounter for screening for diseases of the blood and blood-forming organs and certain disorders involving the immune mechanism: Secondary | ICD-10-CM

## 2021-11-03 DIAGNOSIS — M79644 Pain in right finger(s): Secondary | ICD-10-CM

## 2021-11-03 DIAGNOSIS — Z1321 Encounter for screening for nutritional disorder: Secondary | ICD-10-CM

## 2021-11-03 DIAGNOSIS — Z1329 Encounter for screening for other suspected endocrine disorder: Secondary | ICD-10-CM

## 2021-11-03 MED ORDER — CITALOPRAM HYDROBROMIDE 20 MG PO TABS: 20.0000 mg | ORAL_TABLET | Freq: Every day | ORAL | 1 refills | Status: DC

## 2021-11-03 MED ORDER — HYDROCHLOROTHIAZIDE 25 MG PO TABS: 12.5000 mg | ORAL_TABLET | Freq: Every day | ORAL | 1 refills | Status: DC

## 2021-11-03 MED ORDER — AMLODIPINE BESYLATE 5 MG PO TABS: 5.0000 mg | ORAL_TABLET | Freq: Every day | ORAL | 1 refills | Status: DC

## 2021-11-03 MED ORDER — VALSARTAN 160 MG PO TABS: 160.0000 mg | ORAL_TABLET | Freq: Every day | ORAL | 1 refills | Status: DC

## 2021-11-03 NOTE — Assessment & Plan Note (Signed)
-  Controlled. -Continue current medication regimen. Provided refills. -Will collect CMP for medication monitoring. -Will continue to monitor.

## 2021-11-03 NOTE — Assessment & Plan Note (Signed)
-  Last A1c 5.7, will repeat A1c today.

## 2021-11-03 NOTE — Progress Notes (Signed)
Established Patient Office Visit  Subjective:  Patient ID: Brianna Odom, female    DOB: [birth year]  Age: 55 y.o. MRN: [id number]  CC:  Chief Complaint  Patient presents with   Follow-up   Hypertension    HPI Brianna Odom presents for follow up on hypertension. Pt denies chest pain, palpitations, dizziness, headache, shortness of breath, or lower extremity swelling. Taking medication as directed without side effects. Checks BP at home and readings range 120s/70s. Pt follows a low salt diet. Reports good hydration. Is fasting for blood work. States about 3 weeks tripped and caught her body with her right thumb by holding onto the rail and her thumb was sore which has gradually improved. Reports bending right thumb is painful but otherwise doing better.   Mood: Denies mood change, severe anxiety or SI/HI. Reports has been on medication for several years which continues to work well without issues.  Past Medical History:  Diagnosis Date   Anxiety    Depression    Hormone disorder    Hypertension    Urinary incontinence     Past Surgical History:  Procedure Laterality Date   BREAST BIOPSY  1981   ROTATOR CUFF REPAIR Left 11/2018   TONSILLECTOMY     TOTAL VAGINAL HYSTERECTOMY  2002    Family History  Problem Relation Age of Onset   Diabetes Father        pre   Hypertension Father     Social History   Socioeconomic History   Marital status: Single    Spouse name: Not on file   Number of children: Not on file   Years of education: Not on file   Highest education level: Not on file  Occupational History   Not on file  Tobacco Use   Smoking status: Never   Smokeless tobacco: Never  Vaping Use   Vaping Use: Never used  Substance and Sexual Activity   Alcohol use: Yes    Alcohol/week: 1.0 - 2.0 standard drink    Types: 1 - 2 Glasses of wine per week   Drug use: No   Sexual activity: Yes    Birth control/protection: None  Other Topics Concern   Not on file  Social  History Narrative   Not on file   Social Determinants of Health   Financial Resource Strain: Not on file  Food Insecurity: Not on file  Transportation Needs: Not on file  Physical Activity: Not on file  Stress: Not on file  Social Connections: Not on file  Intimate Partner Violence: Not on file    Outpatient Medications Prior to Visit  Medication Sig Dispense Refill   Barberry-Oreg Grape-Goldenseal (BERBERINE COMPLEX PO) Take 2 tablets by mouth daily. (Patient not taking: Reported on 08/04/2021)     betamethasone valerate ointment (VALISONE) 0.1 % Use a pea sized amount BID for up to 1-2 weeks as needed. Not for daily long term use. (Patient not taking: Reported on 08/04/2021) 30 g 0   Cholecalciferol (VITAMIN D3) 5000 units CAPS Take 1 capsule by mouth daily.     Coenzyme Q10 (COQ10) 100 MG CAPS Take 1 capsule by mouth daily.     estradiol (ESTRACE VAGINAL) 0.1 MG/GM vaginal cream Place 1 Applicatorful vaginally 3 (three) times a week. For 2 wks, then taper to once weekly for maintenance 42.5 g 1   estradiol (VIVELLE-DOT) 0.025 MG/24HR PLACE 1 PATCH ONTO THE SKIN 2 (TWO) TIMES A WEEK. 24 patch 3   Multiple Vitamin (MULTIVITAMIN) capsule Take  1 capsule by mouth daily.     Probiotic Product (PROBIOTIC DAILY PO) Take 1 tablet by mouth daily. As needed (Patient not taking: Reported on 08/04/2021)     Turmeric 500 MG CAPS Take 2 capsules by mouth daily.     Vitamin D, Ergocalciferol, (DRISDOL) 1.25 MG (50000 UNIT) CAPS capsule TAKE 1 CAPSULE BY MOUTH EVERY 7 DAYS. OFFICE VISIT REQUIRED PRIOR TO ANY FURTHER REFILLS 12 capsule 0   amLODipine (NORVASC) 5 MG tablet Take 1 tablet (5 mg total) by mouth daily. **PLEASE CONTACT OUR OFFICE TO SCHEDULE A FOLLOW UP FOR FUTURE MED REFILLS** 30 tablet 0   citalopram (CELEXA) 20 MG tablet Take 1 tablet (20 mg total) by mouth daily. **PLEASE CONTACT OUR OFFICE TO SCHEDULE A FOLLOW UP FOR FUTURE MED REFILLS** 30 tablet 0   hydrochlorothiazide (HYDRODIURIL) 25  MG tablet Take 0.5 tablets (12.5 mg total) by mouth daily. **PLEASE CONTACT OUR OFFICE TO SCHEDULE A FOLLOW UP FOR FUTURE MED REFILLS** 15 tablet 0   valsartan (DIOVAN) 160 MG tablet Take 1 tablet (160 mg total) by mouth daily. **PLEASE CONTACT OUR OFFICE TO SCHEDULE A FOLLOW UP FOR FUTURE MED REFILLS** 30 tablet 0   No facility-administered medications prior to visit.    Allergies  Allergen Reactions   Diclofenac Diarrhea    Abdominal cramping.    ROS Review of Systems Review of Systems:  A fourteen system review of systems was performed and found to be positive as per HPI.   Objective:    Physical Exam General:  Well Developed, well nourished, in no acute distress  Neuro:  Alert and oriented,  extra-ocular muscles intact  HEENT:  Normocephalic, atraumatic, neck supple Skin:  no gross rash, warm, pink. Cardiac:  RRR, S1 S2 Respiratory: CTA B/L, Not using accessory muscles, speaking in full sentences- unlabored. MSK: no deformity of both hands noted, tenderness of thenar muscle of right thumb, negative Finkelstein's sign, good grip strength b/l  Vascular:  Ext warm, no cyanosis apprec.; cap RF less 2 sec. Psych:  No HI/SI, judgement and insight good, Euthymic mood. Full Affect.  BP 125/78   Pulse 70   Temp (!) 97.2 F (36.2 C)   Ht '5\' 3"'  (1.6 m)   Wt 226 lb (102.5 kg)   SpO2 97%   BMI 40.03 kg/m  Wt Readings from Last 3 Encounters:  11/03/21 226 lb (102.5 kg)  08/04/21 196 lb (88.9 kg)  01/09/21 196 lb 1.6 oz (89 kg)     Health Maintenance Due  Topic Date Due   COVID-19 Vaccine (1) Never done   HIV Screening  Never done   Hepatitis C Screening  Never done   TETANUS/TDAP  Never done   Zoster Vaccines- Shingrix (1 of 2) Never done   INFLUENZA VACCINE  06/23/2021    There are no preventive care reminders to display for this patient.  Lab Results  Component Value Date   TSH 3.620 02/02/2020   Lab Results  Component Value Date   WBC 4.2 02/02/2020   HGB  14.1 02/02/2020   HCT 44.1 02/02/2020   MCV 88 02/02/2020   PLT 245 02/02/2020   Lab Results  Component Value Date   NA 143 02/02/2020   K 4.7 02/02/2020   CO2 22 02/02/2020   GLUCOSE 96 02/02/2020   BUN 13 02/02/2020   CREATININE 0.67 02/02/2020   BILITOT 0.3 02/02/2020   ALKPHOS 115 02/02/2020   AST 28 02/02/2020   ALT 42 (H) 02/02/2020  PROT 6.9 02/02/2020   ALBUMIN 4.6 02/02/2020   CALCIUM 9.2 02/02/2020   Lab Results  Component Value Date   CHOL 183 02/02/2020   Lab Results  Component Value Date   HDL 58 02/02/2020   Lab Results  Component Value Date   LDLCALC 112 (H) 02/02/2020   Lab Results  Component Value Date   TRIG 69 02/02/2020   Lab Results  Component Value Date   CHOLHDL 3.2 02/02/2020   Lab Results  Component Value Date   HGBA1C 5.7 (H) 02/02/2020   Depression screen PHQ 2/9 11/03/2021 08/04/2021 01/09/2021 08/13/2020 02/02/2020  Decreased Interest 0 0 0 0 0  Down, Depressed, Hopeless 0 0 0 0 0  PHQ - 2 Score 0 0 0 0 0  Altered sleeping 0 0 0 0 0  Tired, decreased energy 0 0 0 0 0  Change in appetite 0 0 0 0 0  Feeling bad or failure about yourself  0 0 0 0 0  Trouble concentrating 0 0 0 0 0  Moving slowly or fidgety/restless 0 0 0 0 0  Suicidal thoughts 0 0 0 0 0  PHQ-9 Score 0 0 0 0 0  Difficult doing work/chores Not difficult at all - - - -  Some recent data might be hidden   GAD 7 : Generalized Anxiety Score 11/03/2021 08/04/2021  Nervous, Anxious, on Edge 0 0  Control/stop worrying 0 0  Worry too much - different things 0 0  Trouble relaxing 0 0  Restless 0 0  Easily annoyed or irritable 0 0  Afraid - awful might happen 0 0  Total GAD 7 Score 0 0  Anxiety Difficulty Not difficult at all -        Assessment & Plan:   Problem List Items Addressed This Visit       Cardiovascular and Mediastinum   Essential hypertension - Primary (Chronic)    -Controlled. -Continue current medication regimen. Provided refills. -Will  collect CMP for medication monitoring. -Will continue to monitor.      Relevant Medications   amLODipine (NORVASC) 5 MG tablet   hydrochlorothiazide (HYDRODIURIL) 25 MG tablet   valsartan (DIOVAN) 160 MG tablet   Other Relevant Orders   CBC w/Diff   Comp Met (CMET)     Other   Generalized anxiety disorder (Chronic)    -Controlled. -Continue current medication regimen. -Will continue to monitor.      Relevant Medications   citalopram (CELEXA) 20 MG tablet   Vitamin D deficiency (Chronic)   Relevant Orders   Vitamin D (25 hydroxy)   Prediabetes (Chronic)    -Last A1c 5.7, will repeat A1c today.      Relevant Orders   HgB A1c   Other Visit Diagnoses     Screening for endocrine, nutritional, metabolic and immunity disorder       Relevant Orders   Lipid Profile   HgB A1c   Vitamin D (25 hydroxy)   TSH   Dyslipidemia       Relevant Orders   Lipid Profile   Pain of right thumb          Dyslipidemia: -Last lipid panel: total cholesterol 183, triglycerides 69, HDL 58, LDL 112  -Will repeat lipid panel. Recommend to follow a heart healthy diet low in saturated and trans fats.  Pain of right thumb: -Discussed with patient likely a sprain, no deformity noted so less likely a fracture. Symptoms have improved and recommend to continue with conservative therapy  with ice therapy and brace. If symptoms fail to improve or worsen recommend further evaluation with imaging studies.   Meds ordered this encounter  Medications   DISCONTD: citalopram (CELEXA) 20 MG tablet    Sig: Take 1 tablet (20 mg total) by mouth daily.    Dispense:  90 tablet    Refill:  1   DISCONTD: amLODipine (NORVASC) 5 MG tablet    Sig: Take 1 tablet (5 mg total) by mouth daily.    Dispense:  90 tablet    Refill:  1   DISCONTD: hydrochlorothiazide (HYDRODIURIL) 25 MG tablet    Sig: Take 0.5 tablets (12.5 mg total) by mouth daily.    Dispense:  45 tablet    Refill:  1   DISCONTD: valsartan (DIOVAN)  160 MG tablet    Sig: Take 1 tablet (160 mg total) by mouth daily.    Dispense:  90 tablet    Refill:  1   amLODipine (NORVASC) 5 MG tablet    Sig: Take 1 tablet (5 mg total) by mouth daily.    Dispense:  90 tablet    Refill:  1    Order Specific Question:   Supervising Provider    Answer:   Beatrice Lecher D [[id number]]   citalopram (CELEXA) 20 MG tablet    Sig: Take 1 tablet (20 mg total) by mouth daily.    Dispense:  90 tablet    Refill:  1    Order Specific Question:   Supervising Provider    Answer:   Beatrice Lecher D [[id number]]   hydrochlorothiazide (HYDRODIURIL) 25 MG tablet    Sig: Take 0.5 tablets (12.5 mg total) by mouth daily.    Dispense:  45 tablet    Refill:  1    Order Specific Question:   Supervising Provider    Answer:   Beatrice Lecher D [[id number]]   valsartan (DIOVAN) 160 MG tablet    Sig: Take 1 tablet (160 mg total) by mouth daily.    Dispense:  90 tablet    Refill:  1    Order Specific Question:   Supervising Provider    Answer:   Beatrice Lecher D [[id number]]    Follow-up: Return in about 6 months (around 05/04/2022) for CPE .   Note:  This note was prepared with assistance of Dragon voice recognition software. Occasional wrong-word or sound-a-like substitutions may have occurred due to the inherent limitations of voice recognition software.   Lorrene Reid, PA-C

## 2021-11-03 NOTE — Assessment & Plan Note (Signed)
-  Controlled. Continue current medication regimen. Will continue to monitor. 

## 2021-11-03 NOTE — Patient Instructions (Signed)

## 2021-11-04 LAB — COMPREHENSIVE METABOLIC PANEL
ALT: 18 IU/L (ref 0–32)
AST: 16 IU/L (ref 0–40)
Albumin/Globulin Ratio: 2.1 (ref 1.2–2.2)
Albumin: 4.6 g/dL (ref 3.8–4.9)
Alkaline Phosphatase: 101 IU/L (ref 44–121)
BUN/Creatinine Ratio: 20 (ref 9–23)
BUN: 13 mg/dL (ref 6–24)
Bilirubin Total: 0.3 mg/dL (ref 0.0–1.2)
CO2: 23 mmol/L (ref 20–29)
Calcium: 9.1 mg/dL (ref 8.7–10.2)
Chloride: 103 mmol/L (ref 96–106)
Creatinine, Ser: 0.65 mg/dL (ref 0.57–1.00)
Globulin, Total: 2.2 g/dL (ref 1.5–4.5)
Glucose: 95 mg/dL (ref 70–99)
Potassium: 4.2 mmol/L (ref 3.5–5.2)
Sodium: 141 mmol/L (ref 134–144)
Total Protein: 6.8 g/dL (ref 6.0–8.5)
eGFR: 103 mL/min/{1.73_m2} (ref >59)

## 2021-11-04 LAB — HEMOGLOBIN A1C
Est. average glucose Bld gHb Est-mCnc: 123 mg/dL
Hgb A1c MFr Bld: 5.9 % — ABNORMAL HIGH (ref 4.8–5.6)

## 2021-11-04 LAB — LIPID PANEL
Chol/HDL Ratio: 2.7 ratio (ref 0.0–4.4)
Cholesterol, Total: 216 mg/dL — ABNORMAL HIGH (ref 100–199)
HDL: 79 mg/dL (ref >39)
LDL Chol Calc (NIH): 123 mg/dL — ABNORMAL HIGH (ref 0–99)
Triglycerides: 81 mg/dL (ref 0–149)
VLDL Cholesterol Cal: 14 mg/dL (ref 5–40)

## 2021-11-04 LAB — CBC WITH DIFFERENTIAL/PLATELET
Basophils Absolute: 0 10*3/uL (ref 0.0–0.2)
Basos: 1 %
EOS (ABSOLUTE): 0.1 10*3/uL (ref 0.0–0.4)
Eos: 2 %
Hematocrit: 42.3 % (ref 34.0–46.6)
Hemoglobin: 14.1 g/dL (ref 11.1–15.9)
Immature Grans (Abs): 0 10*3/uL (ref 0.0–0.1)
Immature Granulocytes: 0 %
Lymphocytes Absolute: 1.5 10*3/uL (ref 0.7–3.1)
Lymphs: 32 %
MCH: 29 pg (ref 26.6–33.0)
MCHC: 33.3 g/dL (ref 31.5–35.7)
MCV: 87 fL (ref 79–97)
Monocytes Absolute: 0.3 10*3/uL (ref 0.1–0.9)
Monocytes: 7 %
Neutrophils Absolute: 2.8 10*3/uL (ref 1.4–7.0)
Neutrophils: 58 %
Platelets: 264 10*3/uL (ref 150–450)
RBC: 4.87 x10E6/uL (ref 3.77–5.28)
RDW: 13.3 % (ref 11.7–15.4)
WBC: 4.8 10*3/uL (ref 3.4–10.8)

## 2021-11-04 LAB — TSH: TSH: 2.69 u[IU]/mL (ref 0.450–4.500)

## 2021-11-04 LAB — VITAMIN D 25 HYDROXY (VIT D DEFICIENCY, FRACTURES): Vit D, 25-Hydroxy: 27.8 ng/mL — ABNORMAL LOW (ref 30.0–100.0)

## 2021-11-05 ENCOUNTER — Telehealth: Payer: Self-pay | Admitting: Physician Assistant

## 2021-11-05 ENCOUNTER — Other Ambulatory Visit: Payer: Self-pay

## 2021-11-05 DIAGNOSIS — F411 Generalized anxiety disorder: Secondary | ICD-10-CM

## 2021-11-05 DIAGNOSIS — I10 Essential (primary) hypertension: Secondary | ICD-10-CM

## 2021-11-05 MED ORDER — VALSARTAN 160 MG PO TABS: 160.0000 mg | ORAL_TABLET | Freq: Every day | ORAL | 1 refills | Status: DC

## 2021-11-05 MED ORDER — AMLODIPINE BESYLATE 5 MG PO TABS: 5.0000 mg | ORAL_TABLET | Freq: Every day | ORAL | 1 refills | Status: DC

## 2021-11-05 MED ORDER — HYDROCHLOROTHIAZIDE 25 MG PO TABS: 12.5000 mg | ORAL_TABLET | Freq: Every day | ORAL | 1 refills | Status: DC

## 2021-11-05 MED ORDER — CITALOPRAM HYDROBROMIDE 20 MG PO TABS: 20.0000 mg | ORAL_TABLET | Freq: Every day | ORAL | 1 refills | Status: DC

## 2021-11-05 NOTE — Telephone Encounter (Signed)
Refills were sent to [facility] on [address]. Patient was notified.

## 2021-11-05 NOTE — Telephone Encounter (Signed)
Patient was seen in office on Monday however her prescriptions got sent in to [facility] but she wants them cancelled and sent to [facility] on [address]. I told her she could call the pharmacy and they would transfer them and she said "No that's not what I want, I want them to be cancelled and re-sent" please advise [phone]

## 2022-02-15 ENCOUNTER — Other Ambulatory Visit: Payer: Self-pay | Admitting: Obstetrics and Gynecology

## 2022-02-22 ENCOUNTER — Other Ambulatory Visit: Payer: Self-pay | Admitting: Obstetrics and Gynecology

## 2022-02-23 ENCOUNTER — Other Ambulatory Visit: Payer: Self-pay

## 2022-02-23 DIAGNOSIS — N951 Menopausal and female climacteric states: Secondary | ICD-10-CM

## 2022-02-24 ENCOUNTER — Other Ambulatory Visit: Payer: Self-pay

## 2022-02-24 DIAGNOSIS — N951 Menopausal and female climacteric states: Secondary | ICD-10-CM

## 2022-02-24 MED ORDER — ESTRADIOL 0.025 MG/24HR TD PTTW: 1.0000 | MEDICATED_PATCH | TRANSDERMAL | 0 refills | Status: DC

## 2022-02-24 NOTE — Telephone Encounter (Signed)
AEX scheduled for 05/11/22.  ?

## 2022-02-24 NOTE — Telephone Encounter (Signed)
Pt calling re: Rx refill for estradiol patches. Pt notified that AEX is overdue but mammo is UTD. Pt states she didn't think she had anything left to examine. Pt advised that we still recommend yearly visits and exams with pt's on any kind of hormones. Pt voiced understanding. Msg sent to scheduling to set up.  ? ?Last AEX 02/26/20. ?Last mammo 04/2021.  ?

## 2022-02-25 MED ORDER — ESTRADIOL 0.025 MG/24HR TD PTTW: 1.0000 | MEDICATED_PATCH | TRANSDERMAL | 0 refills | Status: DC

## 2022-02-25 NOTE — Telephone Encounter (Signed)
Patient called and left message stating patch was sent to wrong pharmacy. Rx should go to [facility] Rx sent. ?

## 2022-02-25 NOTE — Addendum Note (Signed)
Addended by: Thamas Jaegers on: 02/25/2022 04:12 PM ? ? Modules accepted: Orders ? ?

## 2022-04-10 ENCOUNTER — Emergency Department (HOSPITAL_BASED_OUTPATIENT_CLINIC_OR_DEPARTMENT_OTHER)
Admission: EM | Admit: 2022-04-10 | Discharge: 2022-04-10 | Disposition: A | Discharge status: Home / Self Care | Payer: BC Managed Care – PPO | Attending: Emergency Medicine | Admitting: Emergency Medicine

## 2022-04-10 ENCOUNTER — Other Ambulatory Visit: Payer: Self-pay

## 2022-04-10 ENCOUNTER — Encounter (HOSPITAL_BASED_OUTPATIENT_CLINIC_OR_DEPARTMENT_OTHER): Payer: Self-pay

## 2022-04-10 ENCOUNTER — Encounter (HOSPITAL_BASED_OUTPATIENT_CLINIC_OR_DEPARTMENT_OTHER): Payer: Self-pay | Admitting: Emergency Medicine

## 2022-04-10 DIAGNOSIS — R1084 Generalized abdominal pain: Secondary | ICD-10-CM | POA: Diagnosis present

## 2022-04-10 DIAGNOSIS — K529 Noninfective gastroenteritis and colitis, unspecified: Secondary | ICD-10-CM

## 2022-04-10 LAB — COMPREHENSIVE METABOLIC PANEL
ALT: 55 U/L — ABNORMAL HIGH (ref 0–44)
AST: 46 U/L — ABNORMAL HIGH (ref 15–41)
Albumin: 4.2 g/dL (ref 3.5–5.0)
Alkaline Phosphatase: 87 U/L (ref 38–126)
Anion gap: 12 (ref 5–15)
BUN: 13 mg/dL (ref 6–20)
CO2: 21 mmol/L — ABNORMAL LOW (ref 22–32)
Calcium: 9.1 mg/dL (ref 8.9–10.3)
Chloride: 104 mmol/L (ref 98–111)
Creatinine, Ser: 0.71 mg/dL (ref 0.44–1.00)
GFR, Estimated: >60 mL/min (ref >60)
Glucose, Bld: 102 mg/dL — ABNORMAL HIGH (ref 70–99)
Potassium: 4.6 mmol/L (ref 3.5–5.1)
Sodium: 137 mmol/L (ref 135–145)
Total Bilirubin: 0.3 mg/dL (ref 0.3–1.2)
Total Protein: 7.4 g/dL (ref 6.5–8.1)

## 2022-04-10 LAB — URINALYSIS, ROUTINE W REFLEX MICROSCOPIC
Bilirubin Urine: NEGATIVE
Glucose, UA: NEGATIVE mg/dL
Hgb urine dipstick: NEGATIVE
Leukocytes,Ua: NEGATIVE
Nitrite: NEGATIVE
Protein, ur: 30 mg/dL — AB
Specific Gravity, Urine: 1.034 — ABNORMAL HIGH (ref 1.005–1.030)
pH: 5.5 (ref 5.0–8.0)

## 2022-04-10 LAB — CBC
HCT: 44.3 % (ref 36.0–46.0)
Hemoglobin: 14.2 g/dL (ref 12.0–15.0)
MCH: 28.3 pg (ref 26.0–34.0)
MCHC: 32.1 g/dL (ref 30.0–36.0)
MCV: 88.4 fL (ref 80.0–100.0)
Platelets: 206 10*3/uL (ref 150–400)
RBC: 5.01 MIL/uL (ref 3.87–5.11)
RDW: 13.6 % (ref 11.5–15.5)
WBC: 3.5 10*3/uL — ABNORMAL LOW (ref 4.0–10.5)
nRBC: 0 % (ref 0.0–0.2)

## 2022-04-10 LAB — LIPASE, BLOOD: Lipase: 13 U/L (ref 11–51)

## 2022-04-10 MED ORDER — CEPHALEXIN 500 MG PO CAPS: 500.0000 mg | ORAL_CAPSULE | Freq: Four times a day (QID) | ORAL | 0 refills | Status: DC

## 2022-04-10 MED ORDER — METRONIDAZOLE 500 MG PO TABS: 500.0000 mg | ORAL_TABLET | Freq: Two times a day (BID) | ORAL | 0 refills | Status: DC

## 2022-04-10 NOTE — Discharge Instructions (Addendum)
Drink plenty of fluids, such as water, ice chips, diluted fruit juice, and low-calorie sports drinks. You can drink milk also, if desired. Avoid drinking fluids that contain a lot of sugar or caffeine, such as energy drinks, sports drinks, and soda. Eat bland, easy-to-digest foods in small amounts as you are able. These foods include bananas, applesauce, rice, lean meats, toast, and crackers.

## 2022-04-10 NOTE — ED Provider Notes (Addendum)
[facility] EMERGENCY DEPT Provider Note   CSN: [id number] Arrival date & time: 04/10/22  1132     History  Chief Complaint  Patient presents with   Abdominal Pain    Brianna Odom is a 56 y.o. female.  Patient is a 56 year old female with no significant past medical history presenting for complaints of abdominal pain.  Patient admits to generalized abdominal pain, nausea without vomiting, and diarrhea.  Patient admits to over 10 episodes of diarrhea for the past 3 days.  Denies chills without fever.  No sick contacts.  No suspicious or foreign food intake. No chest pain or sob.  The history is provided by the patient. No language interpreter was used.  Abdominal Pain Associated symptoms: diarrhea and nausea   Associated symptoms: no chest pain, no chills, no cough, no dysuria, no fever, no hematuria, no shortness of breath, no sore throat and no vomiting       Home Medications Prior to Admission medications   Medication Sig Start Date End Date Taking? Authorizing Provider  cephALEXin (KEFLEX) 500 MG capsule Take 1 capsule (500 mg total) by mouth 4 (four) times daily. 04/10/22  Yes Edwin Dada P, DO  metroNIDAZOLE (FLAGYL) 500 MG tablet Take 1 tablet (500 mg total) by mouth 2 (two) times daily. 04/10/22  Yes Edwin Dada P, DO  amLODipine (NORVASC) 5 MG tablet Take 1 tablet (5 mg total) by mouth daily. 11/05/21   Mayer Masker, PA-C  Barberry-Oreg Grape-Goldenseal (BERBERINE COMPLEX PO) Take 2 tablets by mouth daily. Patient not taking: Reported on 08/04/2021    [provider]  betamethasone valerate ointment (VALISONE) 0.1 % Use a pea sized amount BID for up to 1-2 weeks as needed. Not for daily long term use. Patient not taking: Reported on 08/04/2021 02/26/20   Romualdo Bolk, MD  Cholecalciferol (VITAMIN D3) 5000 units CAPS Take 1 capsule by mouth daily.    [provider]  citalopram (CELEXA) 20 MG tablet Take 1 tablet (20 mg total) by mouth  daily. 11/05/21   Mayer Masker, PA-C  Coenzyme Q10 (COQ10) 100 MG CAPS Take 1 capsule by mouth daily.    [provider]  estradiol (ESTRACE VAGINAL) 0.1 MG/GM vaginal cream Place 1 Applicatorful vaginally 3 (three) times a week. For 2 wks, then taper to once weekly for maintenance 02/02/20   Opalski, Deborah, DO  estradiol (VIVELLE-DOT) 0.025 MG/24HR Place 1 patch onto the skin 2 (two) times a week. 02/26/22   Romualdo Bolk, MD  hydrochlorothiazide (HYDRODIURIL) 25 MG tablet Take 0.5 tablets (12.5 mg total) by mouth daily. 11/05/21   Mayer Masker, PA-C  Multiple Vitamin (MULTIVITAMIN) capsule Take 1 capsule by mouth daily.    [provider]  Probiotic Product (PROBIOTIC DAILY PO) Take 1 tablet by mouth daily. As needed Patient not taking: Reported on 08/04/2021    [provider]  Turmeric 500 MG CAPS Take 2 capsules by mouth daily.    [provider]  valsartan (DIOVAN) 160 MG tablet Take 1 tablet (160 mg total) by mouth daily. 11/05/21   Mayer Masker, PA-C  Vitamin D, Ergocalciferol, (DRISDOL) 1.25 MG (50000 UNIT) CAPS capsule TAKE 1 CAPSULE BY MOUTH EVERY 7 DAYS. OFFICE VISIT REQUIRED PRIOR TO ANY FURTHER REFILLS 02/19/20   Thomasene Lot, DO      Allergies    Diclofenac    Review of Systems   Review of Systems  Constitutional:  Negative for chills and fever.  HENT:  Negative for ear pain  and sore throat.   Eyes:  Negative for pain and visual disturbance.  Respiratory:  Negative for cough and shortness of breath.   Cardiovascular:  Negative for chest pain and palpitations.  Gastrointestinal:  Positive for abdominal pain, diarrhea and nausea. Negative for vomiting.  Genitourinary:  Negative for dysuria and hematuria.  Musculoskeletal:  Negative for arthralgias and back pain.  Skin:  Negative for color change and rash.  Neurological:  Negative for seizures and syncope.  All other systems reviewed and are negative.  Physical  Exam Updated Vital Signs BP (!) 144/91 (BP Location: Right Arm)   Pulse 81   Temp 98 F (36.7 C) (Oral)   Resp 19   Ht 5\' 3"  (1.6 m)   Wt 99.8 kg   SpO2 100%   BMI 38.97 kg/m  Physical Exam Vitals and nursing note reviewed.  Constitutional:      General: She is not in acute distress.    Appearance: She is well-developed.  HENT:     Head: Normocephalic and atraumatic.  Eyes:     Conjunctiva/sclera: Conjunctivae normal.  Cardiovascular:     Rate and Rhythm: Normal rate and regular rhythm.     Heart sounds: No murmur heard. Pulmonary:     Effort: Pulmonary effort is normal. No respiratory distress.     Breath sounds: Normal breath sounds.  Abdominal:     Palpations: Abdomen is soft.     Tenderness: There is generalized abdominal tenderness. There is no guarding or rebound.  Musculoskeletal:        General: No swelling.     Cervical back: Neck supple.  Skin:    General: Skin is warm and dry.     Capillary Refill: Capillary refill takes less than 2 seconds.  Neurological:     Mental Status: She is alert.  Psychiatric:        Mood and Affect: Mood normal.    ED Results / Procedures / Treatments   Labs (all labs ordered are listed, but only abnormal results are displayed) Labs Reviewed  COMPREHENSIVE METABOLIC PANEL - Abnormal; Notable for the following components:      Result Value   CO2 21 (*)    Glucose, Bld 102 (*)    AST 46 (*)    ALT 55 (*)    All other components within normal limits  CBC - Abnormal; Notable for the following components:   WBC 3.5 (*)    All other components within normal limits  URINALYSIS, ROUTINE W REFLEX MICROSCOPIC - Abnormal; Notable for the following components:   APPearance HAZY (*)    Specific Gravity, Urine 1.034 (*)    Ketones, ur TRACE (*)    Protein, ur 30 (*)    All other components within normal limits  LIPASE, BLOOD    EKG None  Radiology No results found.  Procedures Procedures    Medications Ordered in  ED Medications - No data to display  ED Course/ Medical Decision Making/ A&P                           Medical Decision Making Amount and/or Complexity of Data Reviewed Labs: ordered. Radiology: ordered.  Risk Prescription drug management.   69:59 PM 56 year old female with no significant past medical history presenting for complaints of abdominal pain.  Patient is alert and oriented x3, no acute distress, afebrile, stable vital signs.  Abdomen is soft with generalized tenderness in all quadrants.  No  guarding or rigidity.  Laboratory studies demonstrate stable liver profile, lipase, and renal function.  Stable electrolytes despite diarrhea.  Offered and declined CT abdominal imaging.  Patient offered IV fluids and declined.  Recommended to rest, increase oral hydration, electrolyte repletion with Pedialyte or Gatorade diarrhea continues, and BRAT diet.  Discussed with patient possibility of gastroenteritis versus diverticulitis.  Patient requesting antibiotics in case symptoms do not resolve in the next 1-2 days.  Patient currently on day 3 of symptoms.  States that she does not like Augmentin because it is hard on her stomach.  Rocephin and Flagyl given.   Patient in no distress and overall condition improved here in the ED. Detailed discussions were had with the patient regarding current findings, and need for close f/u with PCP or on call doctor. The patient has been instructed to return immediately if the symptoms worsen in any way for re-evaluation. Patient verbalized understanding and is in agreement with current care plan. All questions answered prior to discharge.         Final Clinical Impression(s) / ED Diagnoses Final diagnoses:  Gastroenteritis    Rx / DC Orders ED Discharge Orders          Ordered    cephALEXin (KEFLEX) 500 MG capsule  4 times daily        04/10/22 1314    metroNIDAZOLE (FLAGYL) 500 MG tablet  2 times daily        04/10/22 1314               Franne FortsGray, Byrd Rushlow P, DO 04/13/22 1602    Edwin DadaGray, Montravious Weigelt P, DO 04/13/22 1603

## 2022-04-10 NOTE — ED Triage Notes (Signed)
Pt presents to ED from home C/O nausea, diarrhea, headache since Tuesday. Pt also reports chills Tuesday and Wednesday.

## 2022-04-30 ENCOUNTER — Other Ambulatory Visit: Payer: Self-pay | Admitting: Physician Assistant

## 2022-04-30 DIAGNOSIS — I10 Essential (primary) hypertension: Secondary | ICD-10-CM

## 2022-04-30 DIAGNOSIS — E559 Vitamin D deficiency, unspecified: Secondary | ICD-10-CM

## 2022-04-30 DIAGNOSIS — E669 Obesity, unspecified: Secondary | ICD-10-CM

## 2022-04-30 DIAGNOSIS — E785 Hyperlipidemia, unspecified: Secondary | ICD-10-CM

## 2022-04-30 DIAGNOSIS — R7303 Prediabetes: Secondary | ICD-10-CM

## 2022-04-30 NOTE — Progress Notes (Signed)
56 y.o. [id number] Single White or Caucasian Not Hispanic or Latino female here for annual exam.  H/O hysterectomy. She is on low dose estrogen replacement, doing well, controlling her vasomotor symptoms. Sexually active, no pain.  No bowel or bladder changes. She leaks some urine if she is full, some urgency, tends to occur when she is going up her garage stairs. Can leak a large amounts. No leaking with coughing or sneezing.    She has occasional vulvar itching, wanting a refill of steroid ointment.  No LMP recorded. Patient has had a hysterectomy.          Sexually active: Yes.    The current method of family planning is status post hysterectomy.    Exercising: Yes.    The patient does not participate in regular exercise at present. She does some walking  Smoker:  no  Health Maintenance: Pap:  Hysterectomy  History of abnormal Pap:  no MMG:  05/02/21 density C Bi-rads 1 neg  BMD:   n/a Colonoscopy: 2019 normal 10 year follow up  TDaP:  05/04/22 Gardasil: n/a   reports that she has never smoked. She has never used smokeless tobacco. She reports current alcohol use of about 1.0 - 2.0 standard drink of alcohol per week. She reports that she does not use drugs.  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 Kids are grown, both live in [place], no grandchildren.  Past Medical History:  Diagnosis Date   Anxiety    Depression    Hormone disorder    Hypertension    Urinary incontinence     Past Surgical History:  Procedure Laterality Date   BREAST BIOPSY  1981   ROTATOR CUFF REPAIR Left 11/2018   TONSILLECTOMY     TOTAL VAGINAL HYSTERECTOMY  2002    Current Outpatient Medications  Medication Sig Dispense Refill   amLODIPine-Valsartan-HCTZ 5-160-25 MG TABS Take 1 tablet by mouth daily. 90 tablet 1   citalopram (CELEXA) 20 MG tablet Take 1 tablet (20 mg total) by mouth daily. 90 tablet 1   Coenzyme Q10 (COQ10) 100 MG CAPS Take 1 capsule by mouth daily.     estradiol (VIVELLE-DOT) 0.025  MG/24HR Place 1 patch onto the skin 2 (two) times a week. 24 patch 0   Multiple Vitamin (MULTIVITAMIN) capsule Take 1 capsule by mouth daily.     Probiotic Product (PROBIOTIC DAILY PO) Take 1 tablet by mouth daily. As needed     Turmeric 500 MG CAPS Take 2 capsules by mouth daily.     Vitamin D, Ergocalciferol, (DRISDOL) 1.25 MG (50000 UNIT) CAPS capsule TAKE 1 CAPSULE BY MOUTH EVERY 7 DAYS. OFFICE VISIT REQUIRED PRIOR TO ANY FURTHER REFILLS 12 capsule 0   No current facility-administered medications for this visit.    Family History  Problem Relation Age of Onset   Diabetes Father        pre   Hypertension Father     Review of Systems  All other systems reviewed and are negative.   Exam:   BP 122/70   Pulse 72   Ht 5\' 3"  (1.6 m)   Wt 232 lb 12.8 oz (105.6 kg)   SpO2 100%   BMI 41.24 kg/m   Weight change: @WEIGHTCHANGE @ Height:   Height: 5\' 3"  (160 cm)  Ht Readings from Last 3 Encounters:  05/11/22 5\' 3"  (1.6 m)  05/04/22 5\' 3"  (1.6 m)  04/10/22 5\' 3"  (1.6 m)    General appearance: alert, cooperative and appears stated  age Head: Normocephalic, without obvious abnormality, atraumatic Neck: no adenopathy, supple, symmetrical, trachea midline and thyroid normal to inspection and palpation Lungs: clear to auscultation bilaterally Cardiovascular: regular rate and rhythm Breasts: normal appearance, no masses or tenderness Abdomen: soft, non-tender; non distended,  no masses,  no organomegaly, non tender umbilical hernia Extremities: extremities normal, atraumatic, no cyanosis or edema Skin: Skin color, texture, turgor normal. No rashes or lesions Lymph nodes: Cervical, supraclavicular, and axillary nodes normal. No abnormal inguinal nodes palpated Neurologic: Grossly normal   Pelvic: External genitalia:  no lesions              Urethra:  normal appearing urethra with no masses, tenderness or lesions              Bartholins and Skenes: normal                 Vagina: normal  appearing vagina with normal color and discharge, no lesions              Cervix: absent               Bimanual Exam:  Uterus:  uterus absent              Adnexa: no mass, fullness, tenderness               Rectovaginal: Confirms               Anus:  normal sphincter tone, no lesions  Carolynn Serve chaperoned for the exam.  1. Well woman exam Discussed breast self exam Discussed calcium and vit D intake Mammogram due, she will scheudule Colonoscopy and labs UTD  2. Encounter for monitoring postmenopausal estrogen replacement therapy Doing well, wants to continue  3. Urinary incontinence, unspecified type - Urinalysis,Complete w/RFL Culture -Information on Kegels, bladder training and incontinence given -Discussed option of PT and trial of medication  4. Subacute vulvitis H/O intermittent vulvitis, only external, normal exam today. Discussed vulvar skin care.  -Steroid ointment for prn use - triamcinolone ointment (KENALOG) 0.1 %; Apply topically 2 (two) times daily.  Dispense: 30 g; Refill: 0

## 2022-04-30 NOTE — Patient Instructions (Signed)
Preventive Care 40-58 Years Old, Female Preventive care refers to lifestyle choices and visits with your health care provider that can promote health and wellness. Preventive care visits are also called wellness exams. What can I expect for my preventive care visit? Counseling Your health care provider may ask you questions about your: Medical history, including: Past medical problems. Family medical history. Pregnancy history. Current health, including: Menstrual cycle. Method of birth control. Emotional well-being. Home life and relationship well-being. Sexual activity and sexual health. Lifestyle, including: Alcohol, nicotine or tobacco, and drug use. Access to firearms. Diet, exercise, and sleep habits. Work and work environment. Sunscreen use. Safety issues such as seatbelt and bike helmet use. Physical exam Your health care provider will check your: Height and weight. These may be used to calculate your BMI (body mass index). BMI is a measurement that tells if you are at a healthy weight. Waist circumference. This measures the distance around your waistline. This measurement also tells if you are at a healthy weight and may help predict your risk of certain diseases, such as type 2 diabetes and high blood pressure. Heart rate and blood pressure. Body temperature. Skin for abnormal spots. What immunizations do I need?  Vaccines are usually given at various ages, according to a schedule. Your health care provider will recommend vaccines for you based on your age, medical history, and lifestyle or other factors, such as travel or where you work. What tests do I need? Screening Your health care provider may recommend screening tests for certain conditions. This may include: Lipid and cholesterol levels. Diabetes screening. This is done by checking your blood sugar (glucose) after you have not eaten for a while (fasting). Pelvic exam and Pap test. Hepatitis B test. Hepatitis C  test. HIV (human immunodeficiency virus) test. STI (sexually transmitted infection) testing, if you are at risk. Lung cancer screening. Colorectal cancer screening. Mammogram. Talk with your health care provider about when you should start having regular mammograms. This may depend on whether you have a family history of breast cancer. BRCA-related cancer screening. This may be done if you have a family history of breast, ovarian, tubal, or peritoneal cancers. Bone density scan. This is done to screen for osteoporosis. Talk with your health care provider about your test results, treatment options, and if necessary, the need for more tests. Follow these instructions at home: Eating and drinking  Eat a diet that includes fresh fruits and vegetables, whole grains, lean protein, and low-fat dairy products. Take vitamin and mineral supplements as recommended by your health care provider. Do not drink alcohol if: Your health care provider tells you not to drink. You are pregnant, may be pregnant, or are planning to become pregnant. If you drink alcohol: Limit how much you have to 0-1 drink a day. Know how much alcohol is in your drink. In the [place] one drink equals one 12 oz bottle of beer (355 mL), one 5 oz glass of wine (148 mL), or one 1 oz glass of hard liquor (44 mL). Lifestyle Brush your teeth every morning and night with fluoride toothpaste. Floss one time each day. Exercise for at least 30 minutes 5 or more days each week. Do not use any products that contain nicotine or tobacco. These products include cigarettes, chewing tobacco, and vaping devices, such as e-cigarettes. If you need help quitting, ask your health care provider. Do not use drugs. If you are sexually active, practice safe sex. Use a condom or other form of protection to   prevent STIs. If you do not wish to become pregnant, use a form of birth control. If you plan to become pregnant, see your health care provider for a  prepregnancy visit. Take aspirin only as told by your health care provider. Make sure that you understand how much to take and what form to take. Work with your health care provider to find out whether it is safe and beneficial for you to take aspirin daily. Find healthy ways to manage stress, such as: Meditation, yoga, or listening to music. Journaling. Talking to a trusted person. Spending time with friends and family. Minimize exposure to UV radiation to reduce your risk of skin cancer. Safety Always wear your seat belt while driving or riding in a vehicle. Do not drive: If you have been drinking alcohol. Do not ride with someone who has been drinking. When you are tired or distracted. While texting. If you have been using any mind-altering substances or drugs. Wear a helmet and other protective equipment during sports activities. If you have firearms in your house, make sure you follow all gun safety procedures. Seek help if you have been physically or sexually abused. What's next? Visit your health care provider once a year for an annual wellness visit. Ask your health care provider how often you should have your eyes and teeth checked. Stay up to date on all vaccines. This information is not intended to replace advice given to you by your health care provider. Make sure you discuss any questions you have with your health care provider. Document Revised: 05/07/2021 Document Reviewed: 05/07/2021 Elsevier Patient Education  [place].

## 2022-05-01 ENCOUNTER — Other Ambulatory Visit: Payer: BC Managed Care – PPO

## 2022-05-01 DIAGNOSIS — R7303 Prediabetes: Secondary | ICD-10-CM

## 2022-05-01 DIAGNOSIS — I10 Essential (primary) hypertension: Secondary | ICD-10-CM

## 2022-05-01 DIAGNOSIS — E669 Obesity, unspecified: Secondary | ICD-10-CM

## 2022-05-01 DIAGNOSIS — E559 Vitamin D deficiency, unspecified: Secondary | ICD-10-CM

## 2022-05-01 DIAGNOSIS — E785 Hyperlipidemia, unspecified: Secondary | ICD-10-CM

## 2022-05-03 ENCOUNTER — Other Ambulatory Visit: Payer: Self-pay | Admitting: Physician Assistant

## 2022-05-03 DIAGNOSIS — F411 Generalized anxiety disorder: Secondary | ICD-10-CM

## 2022-05-03 DIAGNOSIS — I10 Essential (primary) hypertension: Secondary | ICD-10-CM

## 2022-05-04 ENCOUNTER — Encounter: Payer: Self-pay | Admitting: Physician Assistant

## 2022-05-04 ENCOUNTER — Ambulatory Visit (INDEPENDENT_AMBULATORY_CARE_PROVIDER_SITE_OTHER): Payer: BC Managed Care – PPO | Admitting: Physician Assistant

## 2022-05-04 VITALS — BP 105/70 | HR 89 | Temp 97.7°F | Ht 63.0 in | Wt 231.0 lb

## 2022-05-04 DIAGNOSIS — I10 Essential (primary) hypertension: Secondary | ICD-10-CM

## 2022-05-04 DIAGNOSIS — F411 Generalized anxiety disorder: Secondary | ICD-10-CM

## 2022-05-04 DIAGNOSIS — Z23 Encounter for immunization: Secondary | ICD-10-CM | POA: Diagnosis not present

## 2022-05-04 DIAGNOSIS — Z0000 Encounter for general adult medical examination without abnormal findings: Secondary | ICD-10-CM | POA: Diagnosis not present

## 2022-05-04 MED ORDER — AMLODIPINE-VALSARTAN-HCTZ 5-160-25 MG PO TABS: 1.0000 | ORAL_TABLET | Freq: Every day | ORAL | 1 refills | Status: DC

## 2022-05-04 MED ORDER — CITALOPRAM HYDROBROMIDE 20 MG PO TABS: 20.0000 mg | ORAL_TABLET | Freq: Every day | ORAL | 1 refills | Status: DC

## 2022-05-04 NOTE — Progress Notes (Signed)
Complete physical exam   Patient: Brianna Odom   DOB: 04-26-1966   56 y.o. Female  MRN: [id number] Visit Date: 05/04/2022   Chief Complaint  Patient presents with   Annual Exam   Subjective    Brianna Odom is a 56 y.o. female who presents today for a complete physical exam.  She reports consuming a general diet.  Walking daily.  She generally feels fairly well. She does not have additional problems to discuss today.     Past Medical History:  Diagnosis Date   Anxiety    Depression    Hormone disorder    Hypertension    Urinary incontinence    Past Surgical History:  Procedure Laterality Date   BREAST BIOPSY  1981   ROTATOR CUFF REPAIR Left 11/2018   TONSILLECTOMY     TOTAL VAGINAL HYSTERECTOMY  2002   Social History   Socioeconomic History   Marital status: Single    Spouse name: Not on file   Number of children: Not on file   Years of education: Not on file   Highest education level: Not on file  Occupational History   Not on file  Tobacco Use   Smoking status: Never   Smokeless tobacco: Never  Vaping Use   Vaping Use: Never used  Substance and Sexual Activity   Alcohol use: Yes    Alcohol/week: 1.0 - 2.0 standard drink of alcohol    Types: 1 - 2 Glasses of wine per week   Drug use: No   Sexual activity: Yes    Birth control/protection: None  Other Topics Concern   Not on file  Social History Narrative   Not on file   Social Determinants of Health   Financial Resource Strain: Not on file  Food Insecurity: Not on file  Transportation Needs: Not on file  Physical Activity: Not on file  Stress: Not on file  Social Connections: Not on file  Intimate Partner Violence: Not on file     Medications: Outpatient Medications Prior to Visit  Medication Sig   Coenzyme Q10 (COQ10) 100 MG CAPS Take 1 capsule by mouth daily.   estradiol (ESTRACE VAGINAL) 0.1 MG/GM vaginal cream Place 1 Applicatorful vaginally 3 (three) times a week. For 2 wks, then taper to  once weekly for maintenance   estradiol (VIVELLE-DOT) 0.025 MG/24HR Place 1 patch onto the skin 2 (two) times a week.   Multiple Vitamin (MULTIVITAMIN) capsule Take 1 capsule by mouth daily.   Probiotic Product (PROBIOTIC DAILY PO) Take 1 tablet by mouth daily. As needed   Turmeric 500 MG CAPS Take 2 capsules by mouth daily.   Vitamin D, Ergocalciferol, (DRISDOL) 1.25 MG (50000 UNIT) CAPS capsule TAKE 1 CAPSULE BY MOUTH EVERY 7 DAYS. OFFICE VISIT REQUIRED PRIOR TO ANY FURTHER REFILLS   [DISCONTINUED] citalopram (CELEXA) 20 MG tablet TAKE 1 TABLET BY MOUTH EVERY DAY   [DISCONTINUED] amLODipine (NORVASC) 5 MG tablet TAKE 1 TABLET (5 MG TOTAL) BY MOUTH DAILY.   [DISCONTINUED] Barberry-Oreg Grape-Goldenseal (BERBERINE COMPLEX PO) Take 2 tablets by mouth daily. (Patient not taking: Reported on 08/04/2021)   [DISCONTINUED] betamethasone valerate ointment (VALISONE) 0.1 % Use a pea sized amount BID for up to 1-2 weeks as needed. Not for daily long term use. (Patient not taking: Reported on 08/04/2021)   [DISCONTINUED] cephALEXin (KEFLEX) 500 MG capsule Take 1 capsule (500 mg total) by mouth 4 (four) times daily. (Patient not taking: Reported on 05/04/2022)   [DISCONTINUED] Cholecalciferol (VITAMIN D3) 5000 units  CAPS Take 1 capsule by mouth daily.   [DISCONTINUED] hydrochlorothiazide (HYDRODIURIL) 25 MG tablet TAKE 1/2 TABLET BY MOUTH EVERY DAY   [DISCONTINUED] metroNIDAZOLE (FLAGYL) 500 MG tablet Take 1 tablet (500 mg total) by mouth 2 (two) times daily. (Patient not taking: Reported on 05/04/2022)   [DISCONTINUED] valsartan (DIOVAN) 160 MG tablet Take 1 tablet (160 mg total) by mouth daily. (Patient not taking: Reported on 05/04/2022)   No facility-administered medications prior to visit.    Review of Systems Review of Systems:  A fourteen system review of systems was performed and found to be positive as per HPI.  Last CBC Lab Results  Component Value Date   WBC 3.5 (L) 04/10/2022   HGB 14.2  04/10/2022   HCT 44.3 04/10/2022   MCV 88.4 04/10/2022   MCH 28.3 04/10/2022   RDW 13.6 04/10/2022   PLT 206 04/10/2022   Last metabolic panel Lab Results  Component Value Date   GLUCOSE 102 (H) 04/10/2022   NA 137 04/10/2022   K 4.6 04/10/2022   CL 104 04/10/2022   CO2 21 (L) 04/10/2022   BUN 13 04/10/2022   CREATININE 0.71 04/10/2022   GFRNONAA >60 04/10/2022   CALCIUM 9.1 04/10/2022   PROT 7.4 04/10/2022   ALBUMIN 4.2 04/10/2022   LABGLOB 2.2 11/03/2021   AGRATIO 2.1 11/03/2021   BILITOT 0.3 04/10/2022   ALKPHOS 87 04/10/2022   AST 46 (H) 04/10/2022   ALT 55 (H) 04/10/2022   ANIONGAP 12 04/10/2022   Last lipids Lab Results  Component Value Date   CHOL 216 (H) 11/03/2021   HDL 79 11/03/2021   LDLCALC 123 (H) 11/03/2021   TRIG 81 11/03/2021   CHOLHDL 2.7 11/03/2021   Last hemoglobin A1c Lab Results  Component Value Date   HGBA1C 5.9 (H) 11/03/2021   Last thyroid functions Lab Results  Component Value Date   TSH 2.690 11/03/2021   Last vitamin D Lab Results  Component Value Date   VD25OH 27.8 (L) 11/03/2021   Last vitamin B12 and Folate No results found for: "VITAMINB12", "FOLATE"  Objective     BP 105/70   Pulse 89   Temp 97.7 F (36.5 C)   Ht 5\' 3"  (1.6 m)   Wt 231 lb (104.8 kg)   SpO2 97%   BMI 40.92 kg/m  BP Readings from Last 3 Encounters:  05/04/22 105/70  04/10/22 (!) 144/91  11/03/21 125/78   Wt Readings from Last 3 Encounters:  05/04/22 231 lb (104.8 kg)  04/10/22 220 lb (99.8 kg)  11/03/21 226 lb (102.5 kg)    Physical Exam   General Appearance:     Alert, cooperative, in no acute distress, appears stated age   Head:    Normocephalic, without obvious abnormality, atraumatic  Eyes:    PERRL, conjunctiva/corneas clear, EOM's intact, fundi    benign, both eyes  Ears:    Normal TM's and external ear canals, both ears  Nose:   Nares normal, septum midline, mucosa normal, no drainage    or sinus tenderness  Throat:   Lips,  mucosa, and tongue normal; teeth and gums normal  Neck:   Supple, symmetrical, trachea midline, no adenopathy;    thyroid:  no enlargement/tenderness/nodules; no JVD  Back:     Symmetric, no curvature, ROM normal, no CVA tenderness  Lungs:     Clear to auscultation bilaterally, respirations unlabored  Chest Wall:    No tenderness or deformity   Heart:    Normal heart rate.  Normal rhythm. No murmurs, rubs, or gallops.   Breast Exam:    deferred  Abdomen:     Soft, non-tender, bowel sounds active all four quadrants,    no masses, no organomegaly  Pelvic:    deferred  Extremities:   All extremities are intact. No cyanosis or edema  Pulses:   2+ and symmetric all extremities  Skin:   Skin color, texture, turgor normal, no rashes or lesions  Lymph nodes:   Cervical and supraclavicular nodes normal  Neurologic:   CNII-XII grossly intact.     Last depression screening scores    05/04/2022    2:11 PM 11/03/2021   11:28 AM 08/04/2021    1:16 PM  PHQ 2/9 Scores  PHQ - 2 Score 0 0 0  PHQ- 9 Score 0 0 0   Last fall risk screening    05/04/2022    2:11 PM  Fall Risk   Falls in the past year? 0  Number falls in past yr: 0  Injury with Fall? 0  Risk for fall due to : No Fall Risks  Follow up Falls evaluation completed     No results found for any visits on 05/04/22.  Assessment & Plan    Routine Health Maintenance and Physical Exam  Exercise Activities and Dietary recommendations -Discussed heart healthy diet low in fat and carbohydrates.   Immunization History  Administered Date(s) Administered   Influenza Inj Mdck Quad Pf 09/28/2016, 09/12/2018   Influenza, Seasonal, Injecte, Preservative Fre 01/13/2016, 09/28/2016   Influenza,inj,Quad PF,6+ Mos 11/06/2019   Influenza-Unspecified 09/28/2016, 08/19/2017   Tdap 05/04/2022    Health Maintenance  Topic Date Due   COVID-19 Vaccine (1) Never done   HIV Screening  Never done   Hepatitis C Screening  Never done   Zoster  Vaccines- Shingrix (1 of 2) Never done   INFLUENZA VACCINE  06/23/2022   MAMMOGRAM  05/03/2023   COLONOSCOPY (Pts [id number] Insurance coverage will need to be confirmed)  04/15/2026   TETANUS/TDAP  05/04/2032   HPV VACCINES  Aged Out   PAP SMEAR-Modifier  Discontinued    Discussed health benefits of physical activity, and encouraged her to engage in regular exercise appropriate for her age and condition.  Problem List Items Addressed This Visit       Cardiovascular and Mediastinum   Essential hypertension (Chronic)   Relevant Medications   amLODIPine-Valsartan-HCTZ 5-160-25 MG TABS     Other   Generalized anxiety disorder (Chronic)   Relevant Medications   citalopram (CELEXA) 20 MG tablet   Other Visit Diagnoses     Healthcare maintenance    -  Primary   Need for Tdap vaccination       Relevant Orders   Tdap vaccine greater than or equal to 7yo IM (Completed)      Patient agreeable to Tdap. Patient will schedule lab visit this week for re-attempt to collect fasting labs. BP controlled. Provided medication refills. Continue with good hydration.   Return in about 6 months (around 11/03/2022) for HTN, mood; lab visit this week for FBW.       Mayer MaskerMaritza Jaran Sainz, PA-C  [facility] [phone] (phone) [phone] (fax)  [facility]

## 2022-05-05 ENCOUNTER — Other Ambulatory Visit: Payer: BC Managed Care – PPO

## 2022-05-06 LAB — COMPREHENSIVE METABOLIC PANEL
ALT: 22 IU/L (ref 0–32)
AST: 16 IU/L (ref 0–40)
Albumin/Globulin Ratio: 2 (ref 1.2–2.2)
Albumin: 4.8 g/dL (ref 3.8–4.9)
Alkaline Phosphatase: 111 IU/L (ref 44–121)
BUN/Creatinine Ratio: 19 (ref 9–23)
BUN: 13 mg/dL (ref 6–24)
Bilirubin Total: 0.4 mg/dL (ref 0.0–1.2)
CO2: 20 mmol/L (ref 20–29)
Calcium: 9.6 mg/dL (ref 8.7–10.2)
Chloride: 102 mmol/L (ref 96–106)
Creatinine, Ser: 0.68 mg/dL (ref 0.57–1.00)
Globulin, Total: 2.4 g/dL (ref 1.5–4.5)
Glucose: 108 mg/dL — ABNORMAL HIGH (ref 70–99)
Potassium: 4.3 mmol/L (ref 3.5–5.2)
Sodium: 140 mmol/L (ref 134–144)
Total Protein: 7.2 g/dL (ref 6.0–8.5)
eGFR: 102 mL/min/{1.73_m2} (ref >59)

## 2022-05-06 LAB — CBC
Hematocrit: 42.5 % (ref 34.0–46.6)
Hemoglobin: 14.2 g/dL (ref 11.1–15.9)
MCH: 28.9 pg (ref 26.6–33.0)
MCHC: 33.4 g/dL (ref 31.5–35.7)
MCV: 86 fL (ref 79–97)
Platelets: 293 10*3/uL (ref 150–450)
RBC: 4.92 x10E6/uL (ref 3.77–5.28)
RDW: 13.2 % (ref 11.7–15.4)
WBC: 5.1 10*3/uL (ref 3.4–10.8)

## 2022-05-06 LAB — TSH: TSH: 3.22 u[IU]/mL (ref 0.450–4.500)

## 2022-05-06 LAB — LIPID PANEL
Chol/HDL Ratio: 3 ratio (ref 0.0–4.4)
Cholesterol, Total: 216 mg/dL — ABNORMAL HIGH (ref 100–199)
HDL: 71 mg/dL (ref >39)
LDL Chol Calc (NIH): 132 mg/dL — ABNORMAL HIGH (ref 0–99)
Triglycerides: 71 mg/dL (ref 0–149)
VLDL Cholesterol Cal: 13 mg/dL (ref 5–40)

## 2022-05-06 LAB — HEMOGLOBIN A1C
Est. average glucose Bld gHb Est-mCnc: 123 mg/dL
Hgb A1c MFr Bld: 5.9 % — ABNORMAL HIGH (ref 4.8–5.6)

## 2022-05-06 LAB — VITAMIN D 25 HYDROXY (VIT D DEFICIENCY, FRACTURES): Vit D, 25-Hydroxy: 29.2 ng/mL — ABNORMAL LOW (ref 30.0–100.0)

## 2022-05-11 ENCOUNTER — Ambulatory Visit (INDEPENDENT_AMBULATORY_CARE_PROVIDER_SITE_OTHER): Payer: BC Managed Care – PPO | Admitting: Obstetrics and Gynecology

## 2022-05-11 ENCOUNTER — Encounter: Payer: Self-pay | Admitting: Obstetrics and Gynecology

## 2022-05-11 VITALS — BP 122/70 | HR 72 | Ht 63.0 in | Wt 232.8 lb

## 2022-05-11 DIAGNOSIS — N763 Subacute and chronic vulvitis: Secondary | ICD-10-CM | POA: Diagnosis not present

## 2022-05-11 DIAGNOSIS — R32 Unspecified urinary incontinence: Secondary | ICD-10-CM

## 2022-05-11 DIAGNOSIS — Z01419 Encounter for gynecological examination (general) (routine) without abnormal findings: Secondary | ICD-10-CM | POA: Diagnosis not present

## 2022-05-11 DIAGNOSIS — Z79890 Hormone replacement therapy (postmenopausal): Secondary | ICD-10-CM | POA: Diagnosis not present

## 2022-05-11 DIAGNOSIS — Z5181 Encounter for therapeutic drug level monitoring: Secondary | ICD-10-CM | POA: Diagnosis not present

## 2022-05-11 LAB — URINALYSIS, COMPLETE W/RFL CULTURE
Bacteria, UA: NONE SEEN /HPF
Bilirubin Urine: NEGATIVE
Glucose, UA: NEGATIVE
Hgb urine dipstick: NEGATIVE
Hyaline Cast: NONE SEEN /LPF
Ketones, ur: NEGATIVE
Leukocyte Esterase: NEGATIVE
Nitrites, Initial: NEGATIVE
Protein, ur: NEGATIVE
RBC / HPF: NONE SEEN /HPF (ref 0–2)
Specific Gravity, Urine: 1.02 (ref 1.001–1.035)
WBC, UA: NONE SEEN /HPF (ref 0–5)
pH: 7.5 (ref 5.0–8.0)

## 2022-05-11 LAB — NO CULTURE INDICATED

## 2022-05-11 MED ORDER — TRIAMCINOLONE ACETONIDE 0.1 % EX OINT: TOPICAL_OINTMENT | Freq: Two times a day (BID) | CUTANEOUS | 0 refills | Status: DC

## 2022-05-11 MED ORDER — ESTRADIOL 0.025 MG/24HR TD PTTW: 1.0000 | MEDICATED_PATCH | TRANSDERMAL | 3 refills | Status: DC

## 2022-05-11 NOTE — Patient Instructions (Addendum)
EXERCISE   We recommended that you start or continue a regular exercise program for good health. Physical activity is anything that gets your body moving, some is better than none. The CDC recommends 150 minutes per week of Moderate-Intensity Aerobic Activity and 2 or more days of Muscle Strengthening Activity.  Benefits of exercise are limitless: helps weight loss/weight maintenance, improves mood and energy, helps with depression and anxiety, improves sleep, tones and strengthens muscles, improves balance, improves bone density, protects from chronic conditions such as heart disease, high blood pressure and diabetes and so much more. To learn more visit: https://www.cdc.gov/physicalactivity/index.html  DIET: Good nutrition starts with a healthy diet of fruits, vegetables, whole grains, and lean protein sources. Drink plenty of water for hydration. Minimize empty calories, sodium, sweets. For more information about dietary recommendations visit: https://health.gov/our-work/nutrition-physical-activity/dietary-guidelines and https://www.myplate.gov/  ALCOHOL:  Women should limit their alcohol intake to no more than 7 drinks/beers/glasses of wine (combined, not each!) per week. Moderation of alcohol intake to this level decreases your risk of breast cancer and liver damage.  If you are concerned that you may have a problem, or your friends have told you they are concerned about your drinking, there are many resources to help. A well-known program that is free, effective, and available to all people all over the nation is Alcoholics Anonymous.  Check out this site to learn more: https://www.aa.org/   CALCIUM AND VITAMIN D:  Adequate intake of calcium and Vitamin D are recommended for bone health.  You should be getting between 1000-1200 mg of calcium and 800 units of Vitamin D daily between diet and supplements  PAP SMEARS:  Pap smears, to check for cervical cancer or precancers,  have traditionally been  done yearly, scientific advances have shown that most women can have pap smears less often.  However, every woman still should have a physical exam from her gynecologist every year. It will include a breast check, inspection of the vulva and vagina to check for abnormal growths or skin changes, a visual exam of the cervix, and then an exam to evaluate the size and shape of the uterus and ovaries. We will also provide age appropriate advice regarding health maintenance, like when you should have certain vaccines, screening for sexually transmitted diseases, bone density testing, colonoscopy, mammograms, etc.   MAMMOGRAMS:  All women over 40 years old should have a routine mammogram.   COLON CANCER SCREENING: Now recommend starting at age 45. At this time colonoscopy is not covered for routine screening until 50. There are take home tests that can be done between 45-49.   COLONOSCOPY:  Colonoscopy to screen for colon cancer is recommended for all women at age 50.  We know, you hate the idea of the prep.  We agree, BUT, having colon cancer and not knowing it is worse!!  Colon cancer so often starts as a polyp that can be seen and removed at colonscopy, which can quite literally save your life!  And if your first colonoscopy is normal and you have no family history of colon cancer, most women don't have to have it again for 10 years.  Once every ten years, you can do something that may end up saving your life, right?  We will be happy to help you get it scheduled when you are ready.  Be sure to check your insurance coverage so you understand how much it will cost.  It may be covered as a preventative service at no cost, but you should check   your particular policy.      Breast Self-Awareness Breast self-awareness means being familiar with how your breasts look and feel. It involves checking your breasts regularly and reporting any changes to your health care provider. Practicing breast self-awareness is  important. A change in your breasts can be a sign of a serious medical problem. Being familiar with how your breasts look and feel allows you to find any problems early, when treatment is more likely to be successful. All women should practice breast self-awareness, including women who have had breast implants. How to do a breast self-exam One way to learn what is normal for your breasts and whether your breasts are changing is to do a breast self-exam. To do a breast self-exam: Look for Changes  Remove all the clothing above your waist. Stand in front of a mirror in a room with good lighting. Put your hands on your hips. Push your hands firmly downward. Compare your breasts in the mirror. Look for differences between them (asymmetry), such as: Differences in shape. Differences in size. Puckers, dips, and bumps in one breast and not the other. Look at each breast for changes in your skin, such as: Redness. Scaly areas. Look for changes in your nipples, such as: Discharge. Bleeding. Dimpling. Redness. A change in position. Feel for Changes Carefully feel your breasts for lumps and changes. It is best to do this while lying on your back on the floor and again while sitting or standing in the shower or tub with soapy water on your skin. Feel each breast in the following way: Place the arm on the side of the breast you are examining above your head. Feel your breast with the other hand. Start in the nipple area and make  inch (2 cm) overlapping circles to feel your breast. Use the pads of your three middle fingers to do this. Apply light pressure, then medium pressure, then firm pressure. The light pressure will allow you to feel the tissue closest to the skin. The medium pressure will allow you to feel the tissue that is a little deeper. The firm pressure will allow you to feel the tissue close to the ribs. Continue the overlapping circles, moving downward over the breast until you feel your  ribs below your breast. Move one finger-width toward the center of the body. Continue to use the  inch (2 cm) overlapping circles to feel your breast as you move slowly up toward your collarbone. Continue the up and down exam using all three pressures until you reach your armpit.  Write Down What You Find  Write down what is normal for each breast and any changes that you find. Keep a written record with breast changes or normal findings for each breast. By writing this information down, you do not need to depend only on memory for size, tenderness, or location. Write down where you are in your menstrual cycle, if you are still menstruating. If you are having trouble noticing differences in your breasts, do not get discouraged. With time you will become more familiar with the variations in your breasts and more comfortable with the exam. How often should I examine my breasts? Examine your breasts every month. If you are breastfeeding, the best time to examine your breasts is after a feeding or after using a breast pump. If you menstruate, the best time to examine your breasts is 5-7 days after your period is over. During your period, your breasts are lumpier, and it may be more   difficult to notice changes. When should I see my health care provider? See your health care provider if you notice: A change in shape or size of your breasts or nipples. A change in the skin of your breast or nipples, such as a reddened or scaly area. Unusual discharge from your nipples. A lump or thick area that was not there before. Pain in your breasts. Anything that concerns you. Urinary Incontinence Urinary incontinence refers to a condition in which a person is unable to control where and when to pass urine. A person with this condition will urinate involuntarily. This means that the person urinates when he or she does not mean to. What are the causes? This condition may be caused  by: Medicines. Infections. Constipation. Overactive bladder muscles. Weak bladder muscles. Weak pelvic floor muscles. These muscles provide support for the bladder, intestine, and, in women, the uterus. Enlarged prostate in men. The prostate is a gland near the bladder. When it gets too big, it can pinch the urethra. With the urethra blocked, the bladder can weaken and lose the ability to empty properly. Surgery. Emotional factors, such as anxiety, stress, or post-traumatic stress disorder (PTSD). Spinal cord injury, nerve injury, or other neurological conditions. Pelvic organ prolapse. This happens in women when organs move out of place and into the vagina. This movement can prevent the bladder and urethra from working properly. What increases the risk? The following factors may make you more likely to develop this condition: Age. The older you are, the higher the risk. Obesity. Being physically inactive. Pregnancy and childbirth. Menopause. Diseases that affect the nerves or spinal cord. Long-term, or chronic, coughing. This can increase pressure on the bladder and pelvic floor muscles. What are the signs or symptoms? Symptoms may vary depending on the type of urinary incontinence you have. They include: A sudden urge to urinate, and passing urine involuntarily before you can get to a bathroom (urge incontinence). Suddenly passing urine when doing activities that force urine to pass, such as coughing, laughing, exercising, or sneezing (stress incontinence). Needing to urinate often but urinating only a small amount, or constantly dribbling urine (overflow incontinence). Urinating because you cannot get to the bathroom in time due to a physical disability, such as arthritis or injury, or due to a communication or thinking problem, such as Alzheimer's disease (functional incontinence). How is this diagnosed? This condition may be diagnosed based on: Your medical history. A physical  exam. Tests, such as: Urine tests. X-rays of your kidney and bladder. Ultrasound. CT scan. Cystoscopy. In this procedure, a health care provider inserts a tube with a light and camera (cystoscope) through the urethra and into the bladder to check for problems. Urodynamic testing. These tests assess how well the bladder, urethra, and sphincter can store and release urine. There are different types of urodynamic tests, and they vary depending on what the test is measuring. To help diagnose your condition, your health care provider may recommend that you keep a log of when you urinate and how much you urinate. How is this treated? Treatment for this condition depends on the type of incontinence that you have and its cause. Treatment may include: Lifestyle changes, such as: Quitting smoking. Maintaining a healthy weight. Staying active. Try to get 150 minutes of moderate-intensity exercise every week. Ask your health care provider which activities are safe for you. Eating a healthy diet. Avoid high-fat foods, like fried foods. Avoid refined carbohydrates like white bread and white rice. Limit how much alcohol  and caffeine you drink. Increase your fiber intake. Healthy sources of fiber include beans, whole grains, and fresh fruits and vegetables. Behavioral changes, such as: Pelvic floor muscle exercises. Bladder training, such as lengthening the amount of time between bathroom breaks, or using the bathroom at regular intervals. Using techniques to suppress bladder urges. This can include distraction techniques or controlled breathing exercises. Medicines, such as: Medicines to relax the bladder muscles and prevent bladder spasms. Medicines to help slow or prevent the growth of a man's prostate. Botox injections. These can help relax the bladder muscles. Treatments, such as: Using pulses of electricity to help change bladder reflexes (electrical nerve stimulation). For women, using a medical  device to prevent urine leaks. This is a small, tampon-like, disposable device that is inserted into the urethra. Injecting collagen or carbon beads (bulking agents) into the urinary sphincter. These can help thicken tissue and close the bladder opening. Surgery. Follow these instructions at home: Lifestyle Limit alcohol and caffeine. These can fill your bladder quickly and irritate it. Keep yourself clean to help prevent odors and skin damage. Ask your health care provider about special skin creams and cleansers that can protect the skin from urine. Consider wearing pads or adult diapers. Make sure to change them regularly, and always change them right after experiencing incontinence. General instructions Take over-the-counter and prescription medicines only as told by your health care provider. Use the bathroom about every 3-4 hours, even if you do not feel the need to urinate. Try to empty your bladder completely every time. After urinating, wait a minute. Then try to urinate again. Make sure you are in a relaxed position while urinating. If your incontinence is caused by nerve problems, keep a log of the medicines you take and the times you go to the bathroom. Keep all follow-up visits. This is important. Where to find more information [employer] of Diabetes and Digestive and Kidney Diseases: CarFlippers.tn American Urology Association: www.urologyhealth.org Contact a health care provider if: You have pain that gets worse. Your incontinence gets worse. Get help right away if: You have a fever or chills. You are unable to urinate. You have redness in your groin area or down your legs. Summary Urinary incontinence refers to a condition in which a person is unable to control where and when to pass urine. This condition may be caused by medicines, infection, weak bladder muscles, weak pelvic floor muscles, enlargement of the prostate (in men), or surgery. Factors such as older  age, obesity, pregnancy and childbirth, menopause, neurological diseases, and chronic coughing may increase your risk for developing this condition. Types of urinary incontinence include urge incontinence, stress incontinence, overflow incontinence, and functional incontinence. This condition is usually treated first with lifestyle and behavioral changes, such as quitting smoking, eating a healthier diet, and doing regular pelvic floor exercises. Other treatment options include medicines, bulking agents, medical devices, electrical nerve stimulation, or surgery. This information is not intended to replace advice given to you by your health care provider. Make sure you discuss any questions you have with your health care provider. Document Revised: 06/14/2020 Document Reviewed: 06/14/2020 Elsevier Patient Education  2023 Elsevier Inc. Kegel Exercises  Kegel exercises can help strengthen your pelvic floor muscles. The pelvic floor is a group of muscles that support your rectum, small intestine, and bladder. In females, pelvic floor muscles also help support the uterus. These muscles help you control the flow of urine and stool (feces). Kegel exercises are painless and simple. They do not  require any equipment. Your provider may suggest Kegel exercises to: Improve bladder and bowel control. Improve sexual response. Improve weak pelvic floor muscles after surgery to remove the uterus (hysterectomy) or after pregnancy, in females. Improve weak pelvic floor muscles after prostate gland removal or surgery, in males. Kegel exercises involve squeezing your pelvic floor muscles. These are the same muscles you squeeze when you try to stop the flow of urine or keep from passing gas. The exercises can be done while sitting, standing, or lying down, but it is best to vary your position. Ask your health care provider which exercises are safe for you. Do exercises exactly as told by your health care provider and  adjust them as directed. Do not begin these exercises until told by your health care provider. Exercises How to do Kegel exercises: Squeeze your pelvic floor muscles tight. You should feel a tight lift in your rectal area. If you are a female, you should also feel a tightness in your vaginal area. Keep your stomach, buttocks, and legs relaxed. Hold the muscles tight for up to 10 seconds. Breathe normally. Relax your muscles for up to 10 seconds. Repeat as told by your health care provider. Repeat this exercise daily as told by your health care provider. Continue to do this exercise for at least 4-6 weeks, or for as long as told by your health care provider. You may be referred to a physical therapist who can help you learn more about how to do Kegel exercises. Depending on your condition, your health care provider may recommend: Varying how long you squeeze your muscles. Doing several sets of exercises every day. Doing exercises for several weeks. Making Kegel exercises a part of your regular exercise routine. This information is not intended to replace advice given to you by your health care provider. Make sure you discuss any questions you have with your health care provider. Document Revised: 03/20/2021 Document Reviewed: 03/20/2021 Elsevier Patient Education  2023 [employer].

## 2022-05-29 ENCOUNTER — Other Ambulatory Visit: Payer: Self-pay | Admitting: Physician Assistant

## 2022-05-29 DIAGNOSIS — Z1231 Encounter for screening mammogram for malignant neoplasm of breast: Secondary | ICD-10-CM

## 2022-06-15 ENCOUNTER — Ambulatory Visit
Admission: RE | Admit: 2022-06-15 | Discharge: 2022-06-15 | Disposition: A | Discharge status: Home / Self Care | Payer: BC Managed Care – PPO | Source: Ambulatory Visit | Attending: Physician Assistant | Admitting: Physician Assistant

## 2022-06-15 DIAGNOSIS — Z1231 Encounter for screening mammogram for malignant neoplasm of breast: Secondary | ICD-10-CM

## 2022-06-16 ENCOUNTER — Ambulatory Visit (INDEPENDENT_AMBULATORY_CARE_PROVIDER_SITE_OTHER): Payer: BC Managed Care – PPO | Admitting: Physician Assistant

## 2022-06-16 ENCOUNTER — Encounter: Payer: Self-pay | Admitting: Physician Assistant

## 2022-06-16 VITALS — BP 110/73 | HR 85 | Wt 234.4 lb

## 2022-06-16 DIAGNOSIS — B9689 Other specified bacterial agents as the cause of diseases classified elsewhere: Secondary | ICD-10-CM | POA: Diagnosis not present

## 2022-06-16 DIAGNOSIS — J988 Other specified respiratory disorders: Secondary | ICD-10-CM | POA: Diagnosis not present

## 2022-06-16 MED ORDER — PREDNISONE 20 MG PO TABS: ORAL_TABLET | ORAL | 0 refills | Status: DC

## 2022-06-16 MED ORDER — HYDROCOD POLI-CHLORPHE POLI ER 10-8 MG/5ML PO SUER: 5.0000 mL | Freq: Two times a day (BID) | ORAL | 0 refills | Status: DC | PRN

## 2022-06-16 MED ORDER — DOXYCYCLINE HYCLATE 100 MG PO TABS: 100.0000 mg | ORAL_TABLET | Freq: Two times a day (BID) | ORAL | 0 refills | Status: DC

## 2022-06-16 NOTE — Progress Notes (Signed)
Established patient acute visit   Patient: Brianna Odom   DOB: Dec 18, 1965   56 y.o. Female  MRN: [id number] Visit Date: 06/16/2022  Chief Complaint  Patient presents with   Cough   Subjective    HPI  Patient presents with c/o dry cough, sinus pressure, nasal congestion, headache, and fatigue. Does report some shortness of breath with activity. Symptoms started 06/02/22 (14 days ago). Has been taking OTC Delsym with expectorant with minimal relief. States is unable to sleep at night because of the cough. No fever, body aches, chills or earache.   Medications: Outpatient Medications Prior to Visit  Medication Sig   amLODIPine-Valsartan-HCTZ 5-160-25 MG TABS Take 1 tablet by mouth daily.   citalopram (CELEXA) 20 MG tablet Take 1 tablet (20 mg total) by mouth daily.   Coenzyme Q10 (COQ10) 100 MG CAPS Take 1 capsule by mouth daily.   estradiol (VIVELLE-DOT) 0.025 MG/24HR Place 1 patch onto the skin 2 (two) times a week.   Multiple Vitamin (MULTIVITAMIN) capsule Take 1 capsule by mouth daily.   Probiotic Product (PROBIOTIC DAILY PO) Take 1 tablet by mouth daily. As needed   triamcinolone ointment (KENALOG) 0.1 % Apply topically 2 (two) times daily.   Turmeric 500 MG CAPS Take 2 capsules by mouth daily.   Vitamin D, Ergocalciferol, (DRISDOL) 1.25 MG (50000 UNIT) CAPS capsule TAKE 1 CAPSULE BY MOUTH EVERY 7 DAYS. OFFICE VISIT REQUIRED PRIOR TO ANY FURTHER REFILLS   No facility-administered medications prior to visit.    Review of Systems Review of Systems:  A fourteen system review of systems was performed and found to be positive as per HPI.  Last CBC Lab Results  Component Value Date   WBC 5.1 05/05/2022   HGB 14.2 05/05/2022   HCT 42.5 05/05/2022   MCV 86 05/05/2022   MCH 28.9 05/05/2022   RDW 13.2 05/05/2022   PLT 293 92/09/9416   Last metabolic panel Lab Results  Component Value Date   GLUCOSE 108 (H) 05/05/2022   NA 140 05/05/2022   K 4.3 05/05/2022   CL 102 05/05/2022    CO2 20 05/05/2022   BUN 13 05/05/2022   CREATININE 0.68 05/05/2022   EGFR 102 05/05/2022   CALCIUM 9.6 05/05/2022   PROT 7.2 05/05/2022   ALBUMIN 4.8 05/05/2022   LABGLOB 2.4 05/05/2022   AGRATIO 2.0 05/05/2022   BILITOT 0.4 05/05/2022   ALKPHOS 111 05/05/2022   AST 16 05/05/2022   ALT 22 05/05/2022   ANIONGAP 12 04/10/2022   Last lipids Lab Results  Component Value Date   CHOL 216 (H) 05/05/2022   HDL 71 05/05/2022   LDLCALC 132 (H) 05/05/2022   TRIG 71 05/05/2022   CHOLHDL 3.0 05/05/2022   Last hemoglobin A1c Lab Results  Component Value Date   HGBA1C 5.9 (H) 05/05/2022   Last thyroid functions Lab Results  Component Value Date   TSH 3.220 05/05/2022     Objective    BP 110/73   Pulse 85   Wt 234 lb 6.4 oz (106.3 kg)   SpO2 97%   BMI 41.52 kg/m    Physical Exam  General:  In no acute distress, non-toxic appearing, ill-appearing. Neuro:  Alert and oriented,  extra-ocular muscles intact  HEENT:  Normocephalic, atraumatic, PERRL, no sinus tenderness, normal TM's of both ears, boggy and red turbinates, erythema of posterior oropharynx without exudates, neck supple, +cervical adenopathy   Skin:  no gross rash, warm, pink. Cardiac:  RRR, S1 S2 Respiratory: mild expiratory wheezing  noted, no crackles, rales or rhonchi. Vascular:  Ext warm, no cyanosis apprec.; cap RF less 2 sec. Psych:  No HI/SI, judgement and insight good, Euthymic mood. Full Affect.   No results found for any visits on 06/16/22.  Assessment & Plan     Patient presenting with respiratory symptoms for more than 10 days with minimal relief with home supportive care and has mild adventitious sounds on exam so will start oral antibiotic therapy for bacterial respiratory infection with Doxycycline 100 mg BID x 7 days. Discussed with patient potential side effects and advised to let me know if unable to tolerate medication. Pt verbalized understanding. Will start prednisone taper to help reduce  inflammation and improve breathing. Recommend to take Tussionex as needed every 12 hours for cough.    Return if symptoms worsen or fail to improve.        Lorrene Reid, PA-C  [facility] at [facility] [phone] (phone) [phone] (fax)  [place]

## 2023-01-04 ENCOUNTER — Telehealth: Payer: Self-pay | Admitting: *Deleted

## 2023-01-04 DIAGNOSIS — I10 Essential (primary) hypertension: Secondary | ICD-10-CM

## 2023-01-04 NOTE — Telephone Encounter (Signed)
Refill request came in for pt for the following, pt has not been seen since 05/04/22 and was to follow up around 11/03/22.  Appointment scheduled for 02/22/23 first available. Pt is requesting refills be sent in to last until that appointment date if possible she is about out.        citalopram (CELEXA) 20 MG tablet   amLODIPine-1m Valsartan-1623mHCTZ -25 MG   [place] no longer available     [place]

## 2023-01-05 ENCOUNTER — Other Ambulatory Visit: Payer: Self-pay | Admitting: Family Medicine

## 2023-01-05 DIAGNOSIS — I10 Essential (primary) hypertension: Secondary | ICD-10-CM

## 2023-01-05 MED ORDER — AMLODIPINE-VALSARTAN-HCTZ 5-160-25 MG PO TABS: 1.0000 | ORAL_TABLET | Freq: Every day | ORAL | 0 refills | Status: DC

## 2023-01-05 MED ORDER — VALSARTAN 160 MG PO TABS: 160.0000 mg | ORAL_TABLET | Freq: Every day | ORAL | 0 refills | Status: DC

## 2023-01-05 MED ORDER — AMLODIPINE BESYLATE 5 MG PO TABS: 5.0000 mg | ORAL_TABLET | Freq: Every day | ORAL | 0 refills | Status: DC

## 2023-01-05 MED ORDER — HYDROCHLOROTHIAZIDE 25 MG PO TABS: 25.0000 mg | ORAL_TABLET | Freq: Every day | ORAL | 0 refills | Status: DC

## 2023-01-05 NOTE — Telephone Encounter (Signed)
Fax from [place] states that Amlodipine-Valsartan-HCTZ 5-160-25 mg no longer comes in a combination formulary and patient will need a prescription for each medication.

## 2023-01-05 NOTE — Telephone Encounter (Signed)
L.O.V: 06/16/22  N.O.V: 02/22/23  L.R.F: 05/04/22 Amlodipine/Vasartan/HCTZ 5-160-25 mg. 90 tab 1 refill.    60 day sent. Pharmacy will notify if formulary needs a new script.

## 2023-01-07 ENCOUNTER — Other Ambulatory Visit: Payer: Self-pay

## 2023-01-07 DIAGNOSIS — F411 Generalized anxiety disorder: Secondary | ICD-10-CM

## 2023-01-07 MED ORDER — CITALOPRAM HYDROBROMIDE 20 MG PO TABS: 20.0000 mg | ORAL_TABLET | Freq: Every day | ORAL | 0 refills | Status: DC

## 2023-02-22 ENCOUNTER — Ambulatory Visit: Payer: BC Managed Care – PPO | Admitting: Family Medicine

## 2023-02-22 ENCOUNTER — Encounter: Payer: Self-pay | Admitting: Family Medicine

## 2023-02-22 VITALS — BP 141/89 | HR 70 | Resp 18 | Ht 63.0 in | Wt 239.0 lb

## 2023-02-22 DIAGNOSIS — I10 Essential (primary) hypertension: Secondary | ICD-10-CM

## 2023-02-22 DIAGNOSIS — Z6841 Body Mass Index (BMI) 40.0 and over, adult: Secondary | ICD-10-CM

## 2023-02-22 DIAGNOSIS — M79604 Pain in right leg: Secondary | ICD-10-CM

## 2023-02-22 DIAGNOSIS — I872 Venous insufficiency (chronic) (peripheral): Secondary | ICD-10-CM

## 2023-02-22 DIAGNOSIS — F411 Generalized anxiety disorder: Secondary | ICD-10-CM

## 2023-02-22 DIAGNOSIS — R7303 Prediabetes: Secondary | ICD-10-CM

## 2023-02-22 DIAGNOSIS — M79605 Pain in left leg: Secondary | ICD-10-CM

## 2023-02-22 MED ORDER — AMLODIPINE-VALSARTAN-HCTZ 5-160-25 MG PO TABS: 1.0000 | ORAL_TABLET | Freq: Every day | ORAL | 1 refills | Status: DC

## 2023-02-22 MED ORDER — VALSARTAN 160 MG PO TABS
160.0000 mg | ORAL_TABLET | Freq: Every day | ORAL | 0 refills | Status: DC
Start: 2023-02-22 — End: 2023-06-15

## 2023-02-22 MED ORDER — AMLODIPINE BESYLATE 5 MG PO TABS
5.0000 mg | ORAL_TABLET | Freq: Every day | ORAL | 0 refills | Status: DC
Start: 2023-02-22 — End: 2023-06-15

## 2023-02-22 MED ORDER — HYDROCHLOROTHIAZIDE 25 MG PO TABS
25.0000 mg | ORAL_TABLET | Freq: Every day | ORAL | 0 refills | Status: DC
Start: 2023-02-22 — End: 2023-06-15

## 2023-02-22 NOTE — Progress Notes (Signed)
Established Patient Office Visit  Subjective   Patient ID: Brianna Odom, female    DOB: 1966-06-04  Age: 57 y.o. MRN: [id number]  Chief Complaint  Patient presents with   Medical Management of Chronic Issues    Fasting    Hypertension   Anxiety   Depression    HPI Brianna Odom is a 57 y.o. female presenting today for follow up of hypertension, mood. Hypertension: Patient here for follow-up of elevated blood pressure. She is not exercising and is not adherent to low salt diet.   Pt denies chest pain, SOB, dizziness, edema, syncope, fatigue or heart palpitations. Taking amlodipine-valsartan-HCTZ, reports excellent compliance with treatment. Denies side effects.  She was previously taking a combination pill but was informed that [facility] does not carry it anymore, so she switched to taking 3 separate pills.  She would prefer a combination pill if this is available anywhere. Mood: Patient is here to follow up for anxiety, currently managing with Celexa. Taking medication without side effects, reports excellent compliance with treatment. Denies mood changes or SI/HI. She feels mood is stable since last visit. Denies chest pain, difficulty concentrating, dizziness, fatigue, insomnia, irritability, palpitations, panic attacks, racing thoughts, SOB, sweating. Denies anhedonia, depressed mood, difficulty concentrating, fatigue, feelings of worthlessness/guilt, hopelessness, hypersomnia, impaired memory, insomnia, psychomotor agitation, psychomotor retardation, recurrent thoughts of death, weight changes.     03/03/23   10:48 AM 05/04/2022    2:11 PM 11/03/2021   11:28 AM  Depression screen PHQ 2/9  Decreased Interest 0 0 0  Down, Depressed, Hopeless 0 0 0  PHQ - 2 Score 0 0 0  Altered sleeping 0 0 0  Tired, decreased energy 0 0 0  Change in appetite 0 0 0  Feeling bad or failure about yourself  0 0 0  Trouble concentrating 0 0 0  Moving slowly or fidgety/restless 0 0 0  Suicidal  thoughts 0 0 0  PHQ-9 Score 0 0 0  Difficult doing work/chores Not difficult at all Not difficult at all Not difficult at all       03/03/23   10:49 AM 05/04/2022    2:11 PM 11/03/2021   11:28 AM 08/04/2021    1:17 PM  GAD 7 : Generalized Anxiety Score  Nervous, Anxious, on Edge 0 0 0 0  Control/stop worrying 0 0 0 0  Worry too much - different things 0 0 0 0  Trouble relaxing 0 0 0 0  Restless 0 0 0 0  Easily annoyed or irritable 0 0 0 0  Afraid - awful might happen 0 0 0 0  Total GAD 7 Score 0 0 0 0  Anxiety Difficulty Not difficult at all Not difficult at all Not difficult at all    She is also complaining of a constant ache in her legs bilaterally that started about 6 months ago.  It has been progressively worsening since it started.  She has to take a Tylenol every night to relieve the pain.  It is worse when sedentary/at rest but does improve upon walking.  ROS Negative unless otherwise noted in HPI   Objective:     BP (!) 141/89 (BP Location: Right Arm, Patient Position: Sitting, Cuff Size: Large)   Pulse 70   Resp 18   Ht 5\' 3"  (1.6 m)   Wt 239 lb (108.4 kg)   SpO2 96%   BMI 42.34 kg/m   Physical Exam Constitutional:      General: She is not in  acute distress.    Appearance: Normal appearance.  HENT:     Head: Normocephalic and atraumatic.  Cardiovascular:     Rate and Rhythm: Normal rate and regular rhythm.     Pulses: Normal pulses.     Heart sounds: No murmur heard.    No friction rub. No gallop.  Pulmonary:     Effort: Pulmonary effort is normal. No respiratory distress.     Breath sounds: No wheezing, rhonchi or rales.  Musculoskeletal:     Right lower leg: No edema.     Left lower leg: No edema.  Skin:    General: Skin is warm and dry.  Neurological:     Mental Status: She is alert and oriented to person, place, and time.     Assessment & Plan:  Essential hypertension Assessment & Plan: BP elevated in office 130/90 and 141/89 initially.   Discussed the importance of medication compliance.  Trying to send in for combined amlodipine-valsartan-HCTZ 5-160-25 tablets.  If not available, will try to do at least 1 combination pill.  Discussed the importance of low-sodium diet for blood pressure.  Patient verbalized understanding but likes salt.  Encouraged her to do her best to limit the sodium in her diet.  Orders: -     amLODIPine-Valsartan-HCTZ; Take 1 tablet by mouth daily.  Dispense: 90 tablet; Refill: 1 -     CBC with Differential/Platelet; Future -     Comprehensive metabolic panel; Future -     Lipid panel; Future  Generalized anxiety disorder Assessment & Plan: GAD-7 score of 0, stable.  Continue Celexa 20 mg daily.  Will continue to monitor.   Prediabetes -     Hemoglobin A1c; Future  Bilateral leg pain Assessment & Plan: Starting with lab work to evaluate for inflammation or muscle damage.  She denies any changes in the color of her urine or any overexertion.  Pain is present in about equal bilaterally.  We discussed getting an ultrasound to rule out arterial or venous disease, though her presentation does sound like claudication and I suspect this most likely is secondary to chronic venous insufficiency.  Pending lab results, patient is agreeable to an ultrasound.  Orders: -     CK -     Aldolase -     Sedimentation rate -     C-reactive protein -     US Venous Img Lower Bilateral (DVT); Future  Morbid obesity with BMI of 40.0-44.9, adult Assessment & Plan: Patient is interested in weight loss and believes that this will help with her leg pain and blood pressure.  We discussed injectable medications that she is most interested in something like Wegovy.  We discussed the GLP-1 medicines and they are mechanism of action, indications, common side effects, and titration/dosing.  I instructed the patient to call her insurance company to see which weight loss medications they will cover and what their requirements are.   Patient will send me a message to share this information with me and I can send in a prescription.  We will follow-up 6 weeks after starting Wegovy if her insurance does cover it.  Patient verbalized understanding and is agreeable with this plan.   Return in about 6 months (around 08/24/2023) for annual physical, fasting blood work 1 week before.  6 weeks after starting [facility] as well..   I spent 45 minutes on the day of the encounter to include pre-visit record review, face-to-face time with the patient, and post visit ordering of  tests.  Brianna Harman, PA

## 2023-02-22 NOTE — Assessment & Plan Note (Signed)
Starting with lab work to evaluate for inflammation or muscle damage.  She denies any changes in the color of her urine or any overexertion.  Pain is present in about equal bilaterally.  We discussed getting an ultrasound to rule out arterial or venous disease, though her presentation does sound like claudication and I suspect this most likely is secondary to chronic venous insufficiency.  Pending lab results, patient is agreeable to an ultrasound.

## 2023-02-22 NOTE — Assessment & Plan Note (Signed)
Patient is interested in weight loss and believes that this will help with her leg pain and blood pressure.  We discussed injectable medications that she is most interested in something like Wegovy.  We discussed the GLP-1 medicines and they are mechanism of action, indications, common side effects, and titration/dosing.  I instructed the patient to call her insurance company to see which weight loss medications they will cover and what their requirements are.  Patient will send me a message to share this information with me and I can send in a prescription.  We will follow-up 6 weeks after starting Wegovy if her insurance does cover it.  Patient verbalized understanding and is agreeable with this plan.

## 2023-02-22 NOTE — Assessment & Plan Note (Addendum)
BP elevated in office 130/90 and 141/89 initially.  Discussed the importance of medication compliance.  Trying to send in for combined amlodipine-valsartan-HCTZ 5-160-25 tablets.  If not available, will try to do at least 1 combination pill.  Discussed the importance of low-sodium diet for blood pressure.  Patient verbalized understanding but likes salt.  Encouraged her to do her best to limit the sodium in her diet.

## 2023-02-22 NOTE — Patient Instructions (Addendum)
I will try sending in the combination blood pressure pill to [facility] for you. We will start with some blood work today, but I have also put in an order for an ultrasound for your legs so that they will already have it if we do need to do this as a next step.  Your homework: Call your insurance to see if they will cover [facility] and what the requirements are.  Send me a message on MyChart, and I will send that into the [place] for you if they will cover it.  It was nice to meet you today!

## 2023-02-22 NOTE — Assessment & Plan Note (Signed)
GAD-7 score of 0, stable.  Continue Celexa 20 mg daily.  Will continue to monitor.

## 2023-02-23 MED ORDER — CITALOPRAM HYDROBROMIDE 20 MG PO TABS
20.0000 mg | ORAL_TABLET | Freq: Every day | ORAL | 0 refills | Status: DC
Start: 2023-02-23 — End: 2023-06-15

## 2023-02-23 NOTE — Addendum Note (Signed)
Addended by: Virgil Benedict on: 02/23/2023 04:14 PM   Modules accepted: Orders

## 2023-02-23 NOTE — Addendum Note (Signed)
Addended by: Gemma Payor on: 02/23/2023 07:44 AM   Modules accepted: Orders

## 2023-02-24 LAB — LIPID PANEL
Chol/HDL Ratio: 3 ratio (ref 0.0–4.4)
Cholesterol, Total: 209 mg/dL — ABNORMAL HIGH (ref 100–199)
HDL: 70 mg/dL (ref >39)
LDL Chol Calc (NIH): 123 mg/dL — ABNORMAL HIGH (ref 0–99)
Triglycerides: 93 mg/dL (ref 0–149)
VLDL Cholesterol Cal: 16 mg/dL (ref 5–40)

## 2023-02-24 LAB — HEMOGLOBIN A1C
Est. average glucose Bld gHb Est-mCnc: 128 mg/dL
Hgb A1c MFr Bld: 6.1 % — ABNORMAL HIGH (ref 4.8–5.6)

## 2023-02-24 LAB — COMPREHENSIVE METABOLIC PANEL
ALT: 20 IU/L (ref 0–32)
AST: 18 IU/L (ref 0–40)
Albumin/Globulin Ratio: 1.9 (ref 1.2–2.2)
Albumin: 4.6 g/dL (ref 3.8–4.9)
Alkaline Phosphatase: 110 IU/L (ref 44–121)
BUN/Creatinine Ratio: 21 (ref 9–23)
BUN: 13 mg/dL (ref 6–24)
Bilirubin Total: 0.3 mg/dL (ref 0.0–1.2)
CO2: 24 mmol/L (ref 20–29)
Calcium: 9.4 mg/dL (ref 8.7–10.2)
Chloride: 102 mmol/L (ref 96–106)
Creatinine, Ser: 0.63 mg/dL (ref 0.57–1.00)
Globulin, Total: 2.4 g/dL (ref 1.5–4.5)
Glucose: 104 mg/dL — ABNORMAL HIGH (ref 70–99)
Potassium: 4.3 mmol/L (ref 3.5–5.2)
Sodium: 139 mmol/L (ref 134–144)
Total Protein: 7 g/dL (ref 6.0–8.5)
eGFR: 103 mL/min/{1.73_m2} (ref >59)

## 2023-02-24 LAB — CBC WITH DIFFERENTIAL/PLATELET
Basophils Absolute: 0.1 10*3/uL (ref 0.0–0.2)
Basos: 1 %
EOS (ABSOLUTE): 0.1 10*3/uL (ref 0.0–0.4)
Eos: 2 %
Hematocrit: 42.1 % (ref 34.0–46.6)
Hemoglobin: 14.1 g/dL (ref 11.1–15.9)
Immature Grans (Abs): 0 10*3/uL (ref 0.0–0.1)
Immature Granulocytes: 0 %
Lymphocytes Absolute: 1.4 10*3/uL (ref 0.7–3.1)
Lymphs: 32 %
MCH: 28.8 pg (ref 26.6–33.0)
MCHC: 33.5 g/dL (ref 31.5–35.7)
MCV: 86 fL (ref 79–97)
Monocytes Absolute: 0.3 10*3/uL (ref 0.1–0.9)
Monocytes: 8 %
Neutrophils Absolute: 2.5 10*3/uL (ref 1.4–7.0)
Neutrophils: 57 %
Platelets: 263 10*3/uL (ref 150–450)
RBC: 4.89 x10E6/uL (ref 3.77–5.28)
RDW: 13.2 % (ref 11.7–15.4)
WBC: 4.4 10*3/uL (ref 3.4–10.8)

## 2023-02-24 LAB — CK: Total CK: 128 U/L (ref 32–182)

## 2023-02-24 LAB — SEDIMENTATION RATE: Sed Rate: 33 mm/hr (ref 0–40)

## 2023-02-24 LAB — ALDOLASE: Aldolase: 6.2 U/L (ref 3.3–10.3)

## 2023-02-24 LAB — C-REACTIVE PROTEIN: CRP: 4 mg/L (ref 0–10)

## 2023-03-02 ENCOUNTER — Other Ambulatory Visit: Payer: Self-pay | Admitting: *Deleted

## 2023-03-02 DIAGNOSIS — M79604 Pain in right leg: Secondary | ICD-10-CM

## 2023-03-04 ENCOUNTER — Ambulatory Visit (HOSPITAL_COMMUNITY)
Admission: RE | Admit: 2023-03-04 | Discharge: 2023-03-04 | Disposition: A | Discharge status: Home / Self Care | Payer: BC Managed Care – PPO | Source: Ambulatory Visit | Attending: Vascular Surgery | Admitting: Vascular Surgery

## 2023-03-04 DIAGNOSIS — M79604 Pain in right leg: Secondary | ICD-10-CM | POA: Diagnosis present

## 2023-03-04 DIAGNOSIS — M79605 Pain in left leg: Secondary | ICD-10-CM | POA: Insufficient documentation

## 2023-03-10 NOTE — Progress Notes (Unsigned)
[facility]           [facility]  History and Physical   Tawn Fitzner is a 57 y.o. female who presents with BLE leg achiness/pain with right worse than left.   Pt states that she had a fall off a short ladder and fell on her tailbone.  She states that since that time, she has had some pain and achiness in her lower legs below the knee with right worse than left. 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  She states that when she is driving and presses on the gas pedal, she feels like her leg goes to sleep" and she wants to shake it to make it feel better.   She does wear compression.  She does not have hx of DVT, but her mother did have hx of DVT.  She does not have any skin color changes in her legs.  She does not really endorse a lot of swelling but if she eats a lot of salt, her legs will swell.  She does not have claudication symptoms or non healing wounds. She states that she had Covid in December and feels her legs are worse since then.  She states that she does develop keloids.   The pt is not on a statin for cholesterol management.  The pt is not on a daily aspirin.   Other AC:  none The pt is on diuretic, ARB for hypertension.   The pt is not on medication for diabetes.   Tobacco hx:  never  Pt does not have family hx of AAA.  Past Medical History:  Diagnosis Date   Anxiety    Depression    Hormone disorder    Hypertension    Urinary incontinence     Past Surgical History:  Procedure Laterality Date   BREAST BIOPSY  1981   ROTATOR CUFF REPAIR Left 11/2018   TONSILLECTOMY     TOTAL VAGINAL HYSTERECTOMY  2002    Social History   Socioeconomic History   Marital status: Single    Spouse name: Not on file   Number of children: Not on file   Years of education: Not on file   Highest education level: Not on file  Occupational History   Not on file  Tobacco Use   Smoking status: Never    Passive exposure:  Never   Smokeless tobacco: Never  Vaping Use   Vaping Use: Never used  Substance and Sexual Activity   Alcohol use: Yes    Alcohol/week: 1.0 - 2.0 standard drink of alcohol    Types: 1 - 2 Glasses of wine per week   Drug use: No   Sexual activity: Yes    Birth control/protection: None  Other Topics Concern   Not on file  Social History Narrative   Not on file   Social Determinants of Health   Financial Resource Strain: Not on file  Food Insecurity: Not on file  Transportation Needs: Not on file  Physical Activity: Not on file  Stress: Not on file  Social Connections: Not on file  Intimate Partner Violence: Not on file    Family History  Problem Relation Age of Onset   Diabetes Father        pre   Hypertension Father     Current Outpatient Medications  Medication Sig Dispense Refill   amLODipine (NORVASC) 5 MG tablet Take  1 tablet (5 mg total) by mouth daily. Take with valsartan and hydrochlorothiazide. Combination pill was not available. 90 tablet 0   citalopram (CELEXA) 20 MG tablet Take 1 tablet (20 mg total) by mouth daily. 60 tablet 0   Coenzyme Q10 (COQ10) 100 MG CAPS Take 1 capsule by mouth daily.     estradiol (VIVELLE-DOT) 0.025 MG/24HR Place 1 patch onto the skin 2 (two) times a week. 24 patch 3   hydrochlorothiazide (HYDRODIURIL) 25 MG tablet Take 1 tablet (25 mg total) by mouth daily. With amlodipine and valsartan.  Combination pill is not available. 90 tablet 0   Multiple Vitamin (MULTIVITAMIN) capsule Take 1 capsule by mouth daily.     Probiotic Product (PROBIOTIC DAILY PO) Take 1 tablet by mouth daily. As needed     Turmeric 500 MG CAPS Take 2 capsules by mouth daily.     valsartan (DIOVAN) 160 MG tablet Take 1 tablet (160 mg total) by mouth daily. Take with amlodipine and hydrochlorothiazide.  Combination pill was not available. 90 tablet 0   Vitamin D, Ergocalciferol, (DRISDOL) 1.25 MG (50000 UNIT) CAPS capsule TAKE 1 CAPSULE BY MOUTH EVERY 7 DAYS. OFFICE  VISIT REQUIRED PRIOR TO ANY FURTHER REFILLS 12 capsule 0   No current facility-administered medications for this visit.    Allergies  Allergen Reactions   Diclofenac Diarrhea    Abdominal cramping.    REVIEW OF SYSTEMS:    denotes positive finding,  denotes negative finding Cardiac  Comments:  Chest pain or chest pressure:    Shortness of breath upon exertion:    Short of breath when lying flat:    Irregular heart rhythm:        Vascular    Pain in calf, thigh, or hip brought on by ambulation:    Pain in feet at night that wakes you up from your sleep:     Blood clot in your veins:    Leg achiness:         Pulmonary    Oxygen at home:    Productive cough:     Wheezing:         Neurologic    Sudden weakness in arms or legs:     Sudden numbness in arms or legs:     Sudden onset of difficulty speaking or slurred speech:    Temporary loss of vision in one eye:     Problems with dizziness:         Gastrointestinal    Blood in stool:     Vomited blood:         Genitourinary    Burning when urinating:     Blood in urine:        Psychiatric    Major depression:         Hematologic    Bleeding problems:    Problems with blood clotting too easily:        Skin    Rashes or ulcers:        Constitutional    Fever or chills:      PHYSICAL EXAMINATION:  Today's Vitals   03/11/23 0905 03/11/23 0908  BP: (!) 137/93   Pulse: 80   Resp: 18   Temp: 97.7 F (36.5 C)   TempSrc: Temporal   SpO2: 97%   Weight: 235 lb 11.2 oz (106.9 kg)   Height:  (1.6 m)   PainSc: 7  7   PainLoc: Leg    Body mass index  is 41.75 kg/m.   General:  WDWN in NAD; vital signs documented above Gait: Not observed HENT: WNL, normocephalic Pulmonary: normal non-labored breathing without wheezing Cardiac: regular HR; without carotid bruits Abdomen: soft, NT, aortic pulse is not palpable Skin: without rashes Vascular Exam/Pulses:  Right Left  Radial 2+ (normal) 2+  (normal)  DP 2+ (normal) 2+ (normal)  PT 2+ (normal) Unable to palpate   Extremities: no skin color changes on BLE  Neurologic: A&O X 3;  moving all extremities equally Psychiatric:  The pt has Normal affect.   Non-Invasive Vascular Imaging:   Venous duplex on 03/04/2023: Venous Reflux Times  +--------------+---------+------+-----------+------------+-------------+  RIGHT        Reflux NoRefluxReflux TimeDiameter cmsComments                               Yes                                        +--------------+---------+------+-----------+------------+-------------+  CFV          no                                                   +--------------+---------+------+-----------+------------+-------------+  FV mid        no                                                   +--------------+---------+------+-----------+------------+-------------+  Popliteal    no                                                   +--------------+---------+------+-----------+------------+-------------+  GSV at [facility]    no                            0.81                   +--------------+---------+------+-----------+------------+-------------+  GSV prox thighno                            0.32                   +--------------+---------+------+-----------+------------+-------------+  GSV mid thigh no                            0.22                   +--------------+---------+------+-----------+------------+-------------+  GSV dist thigh                                      out of fascia  +--------------+---------+------+-----------+------------+-------------+  GSV at knee  out of fascia  +--------------+---------+------+-----------+------------+-------------+  SSV Pop Fossa no                            0.33                   +--------------+---------+------+-----------+------------+-------------+    Summary:  Right:  - No evidence of deep vein thrombosis from the common femoral through the popliteal veins.  - No evidence of superficial venous thrombosis.  - The deep venous system is competent.  - The great and small saphenous veins are competent     Tonia Avino is a 57 y.o. female who presents with: BLE achiness/pain with right worse than left    -pt has easily palpable pedal pulses and do not feel her pain is due to arterial insufficiency.   -pt does not have evidence of DVT.  Pt does not have any venous insufficiency in the deep or superficial systems.   -she did have a fall and may have some back issues causing her leg symptoms.  Recommended she follow up with PCP for appropriate referral.   -discussed with pt about wearing knee high compression stockings, which she does wear while she is working.  -discussed the importance of leg elevation and how to elevate properly - pt is advised to elevate their legs and a diagram is given to them to demonstrate for pt to lay flat on their back with knees elevated and slightly bent with their feet higher than their knees, which puts their feet higher than their heart for 15 minutes per day.  If pt cannot lay flat, advised to lay as flat as possible.  -pt is advised to continue as much walking as possible and avoid sitting or standing for long periods of time.  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  -handout with recommendations given -pt will f/u as needed if she has any issues in the future.     Doreatha Massed, [facility] [phone]  Clinic MD:  Randie Heinz

## 2023-03-11 ENCOUNTER — Encounter: Payer: Self-pay | Admitting: Physician Assistant

## 2023-03-11 ENCOUNTER — Ambulatory Visit: Payer: BC Managed Care – PPO | Admitting: Physician Assistant

## 2023-03-11 VITALS — BP 137/93 | HR 80 | Temp 97.7°F | Resp 18 | Ht 63.0 in | Wt 235.7 lb

## 2023-03-11 DIAGNOSIS — M79605 Pain in left leg: Secondary | ICD-10-CM

## 2023-03-11 DIAGNOSIS — M79604 Pain in right leg: Secondary | ICD-10-CM

## 2023-05-06 ENCOUNTER — Other Ambulatory Visit: Payer: Self-pay

## 2023-05-06 DIAGNOSIS — Z5181 Encounter for therapeutic drug level monitoring: Secondary | ICD-10-CM

## 2023-05-06 MED ORDER — ESTRADIOL 0.025 MG/24HR TD PTTW: 1.0000 | MEDICATED_PATCH | TRANSDERMAL | 0 refills | Status: DC

## 2023-05-06 NOTE — Telephone Encounter (Signed)
[facility] Triage Patient scheduled her aex for Sep 16 with TW and pt wants estrodial to be called in [employer]. Thx  Last AEX 05/11/2022--08/09/2023 w/ TW. Last mammo 06/15/2022-neg birads 1  Rx pend.

## 2023-05-06 NOTE — Telephone Encounter (Signed)
LDVM on machine per DPR advising her that rx was sent.

## 2023-05-17 ENCOUNTER — Other Ambulatory Visit: Payer: Self-pay | Admitting: Family Medicine

## 2023-05-17 DIAGNOSIS — Z1231 Encounter for screening mammogram for malignant neoplasm of breast: Secondary | ICD-10-CM

## 2023-06-15 ENCOUNTER — Other Ambulatory Visit: Payer: Self-pay | Admitting: Family Medicine

## 2023-06-15 DIAGNOSIS — F411 Generalized anxiety disorder: Secondary | ICD-10-CM

## 2023-06-15 DIAGNOSIS — I10 Essential (primary) hypertension: Secondary | ICD-10-CM

## 2023-06-28 ENCOUNTER — Ambulatory Visit
Admission: RE | Admit: 2023-06-28 | Discharge: 2023-06-28 | Disposition: A | Discharge status: Home / Self Care | Payer: BC Managed Care – PPO | Source: Ambulatory Visit | Attending: Family Medicine | Admitting: Family Medicine

## 2023-06-28 DIAGNOSIS — Z1231 Encounter for screening mammogram for malignant neoplasm of breast: Secondary | ICD-10-CM

## 2023-08-02 ENCOUNTER — Ambulatory Visit (INDEPENDENT_AMBULATORY_CARE_PROVIDER_SITE_OTHER): Payer: BC Managed Care – PPO | Admitting: Dermatology

## 2023-08-02 ENCOUNTER — Other Ambulatory Visit: Payer: Self-pay | Admitting: Dermatology

## 2023-08-02 ENCOUNTER — Encounter: Payer: Self-pay | Admitting: Dermatology

## 2023-08-02 DIAGNOSIS — L910 Hypertrophic scar: Secondary | ICD-10-CM

## 2023-08-02 DIAGNOSIS — L814 Other melanin hyperpigmentation: Secondary | ICD-10-CM

## 2023-08-02 DIAGNOSIS — D1801 Hemangioma of skin and subcutaneous tissue: Secondary | ICD-10-CM

## 2023-08-02 DIAGNOSIS — Z1283 Encounter for screening for malignant neoplasm of skin: Secondary | ICD-10-CM

## 2023-08-02 DIAGNOSIS — L710 Perioral dermatitis: Secondary | ICD-10-CM | POA: Diagnosis not present

## 2023-08-02 DIAGNOSIS — W908XXA Exposure to other nonionizing radiation, initial encounter: Secondary | ICD-10-CM | POA: Diagnosis not present

## 2023-08-02 DIAGNOSIS — D485 Neoplasm of uncertain behavior of skin: Secondary | ICD-10-CM

## 2023-08-02 DIAGNOSIS — L578 Other skin changes due to chronic exposure to nonionizing radiation: Secondary | ICD-10-CM

## 2023-08-02 DIAGNOSIS — L821 Other seborrheic keratosis: Secondary | ICD-10-CM

## 2023-08-02 DIAGNOSIS — D229 Melanocytic nevi, unspecified: Secondary | ICD-10-CM

## 2023-08-02 DIAGNOSIS — D225 Melanocytic nevi of trunk: Secondary | ICD-10-CM

## 2023-08-02 MED ORDER — PIMECROLIMUS 1 % EX CREA: TOPICAL_CREAM | Freq: Two times a day (BID) | CUTANEOUS | 2 refills | Status: DC

## 2023-08-02 MED ORDER — DOXYCYCLINE MONOHYDRATE 100 MG PO CAPS
100.0000 mg | ORAL_CAPSULE | Freq: Every day | ORAL | 1 refills | Status: DC
Start: 2023-08-02 — End: 2023-12-21

## 2023-08-02 NOTE — Patient Instructions (Addendum)
Perioral dermatitis is an eruption which is usually located around the mouth and nose.  It can be a rash and/or red bumps.  It occasionally occurs around the eyes.  It may be itchy and may burn.  The exact cause is unknown.  Some types of makeup, moisturizers, dental products, and prescription creams may be partially responsible for the eruption.  Topical steroids such as cortisone creams can temporarily make the rash better but with discontinuation the rash tends to recur and worsen, so they should be avoided. Topical antibiotics, elidel cream, protopic ointment, and oral antibiotics may be prescribed to treat this condition.  Although perioral dermatitis is not an infection, some antibiotics have anti-inflammatory properties that help it greatly.  Start pimecrolimus twice daily to affected areas May continue azelaic twice daily.  Start doxycycline 100 mg every day with food  Doxycycline should be taken with food to prevent nausea. Do not lay down for 30 minutes after taking. Be cautious with sun exposure and use good sun protection while on this medication. Pregnant women should not take this medication.     Wound Care Instructions  Cleanse wound gently with soap and water once a day then pat dry with clean gauze. Apply a thin coat of Petrolatum (petroleum jelly, "Vaseline") over the wound (unless you have an allergy to this). We recommend that you use a new, sterile tube of Vaseline. Do not pick or remove scabs. Do not remove the yellow or white "healing tissue" from the base of the wound.  Cover the wound with fresh, clean, nonstick gauze and secure with paper tape. You may use Band-Aids in place of gauze and tape if the wound is small enough, but would recommend trimming much of the tape off as there is often too much. Sometimes Band-Aids can irritate the skin.  You should call the office for your biopsy report after 1 week if you have not already been contacted.  If you experience any  problems, such as abnormal amounts of bleeding, swelling, significant bruising, significant pain, or evidence of infection, please call the office immediately.  FOR ADULT SURGERY PATIENTS: If you need something for pain relief you may take 1 extra strength Tylenol (acetaminophen) AND 2 Ibuprofen (200mg  each) together every 4 hours as needed for pain. (do not take these if you are allergic to them or if you have a reason you should not take them.) Typically, you may only need pain medication for 1 to 3 days.   Melanoma ABCDEs  Melanoma is the most dangerous type of skin cancer, and is the leading cause of death from skin disease.  You are more likely to develop melanoma if you: Have light-colored skin, light-colored eyes, or red or blond hair Spend a lot of time in the sun Tan regularly, either outdoors or in a tanning bed Have had blistering sunburns, especially during childhood Have a close family member who has had a melanoma Have atypical moles or large birthmarks  Early detection of melanoma is key since treatment is typically straightforward and cure rates are extremely high if we catch it early.   The first sign of melanoma is often a change in a mole or a new dark spot.  The ABCDE system is a way of remembering the signs of melanoma.  A for asymmetry:  The two halves do not match. B for border:  The edges of the growth are irregular. C for color:  A mixture of colors are present instead of an even brown  color. D for diameter:  Melanomas are usually (but not always) greater than 6mm - the size of a pencil eraser. E for evolution:  The spot keeps changing in size, shape, and color.  Please check your skin once per month between visits. You can use a small mirror in front and a large mirror behind you to keep an eye on the back side or your body.   If you see any new or changing lesions before your next follow-up, please call to schedule a visit.  Please continue daily skin protection  including broad spectrum sunscreen SPF 30+ to sun-exposed areas, reapplying every 2 hours as needed when you're outdoors.     Important Information  Due to recent changes in healthcare laws, you may see results of your pathology and/or laboratory studies on MyChart before the doctors have had a chance to review them. We understand that in some cases there may be results that are confusing or concerning to you. Please understand that not all results are received at the same time and often the doctors may need to interpret multiple results in order to provide you with the best plan of care or course of treatment. Therefore, we ask that you please give [place] 2 business days to thoroughly review all your results before contacting the office for clarification. Should we see a critical lab result, you will be contacted sooner.   If You Need Anything After Your Visit  If you have any questions or concerns for your doctor, please call our main line at [phone] If no one answers, please leave a voicemail as directed and we will return your call as soon as possible. Messages left after 4 pm will be answered the following business day.   You may also send [place] a message via MyChart. We typically respond to MyChart messages within 1-2 business days.  For prescription refills, please ask your pharmacy to contact our office. Our fax number is [phone].  If you have an urgent issue when the clinic is closed that cannot wait until the next business day, you can page your doctor at the number below.    Please note that while we do our best to be available for urgent issues outside of office hours, we are not available 24/7.   If you have an urgent issue and are unable to reach [place], you may choose to seek medical care at your doctor's office, retail clinic, urgent care center, or emergency room.  If you have a medical emergency, please immediately call 911 or go to the emergency department. In the event of  inclement weather, please call our main line at [phone] for an update on the status of any delays or closures.  Dermatology Medication Tips: Please keep the boxes that topical medications come in in order to help keep track of the instructions about where and how to use these. Pharmacies typically print the medication instructions only on the boxes and not directly on the medication tubes.   If your medication is too expensive, please contact our office at [phone] or send [place] a message through MyChart.   We are unable to tell what your co-pay for medications will be in advance as this is different depending on your insurance coverage. However, we may be able to find a substitute medication at lower cost or fill out paperwork to get insurance to cover a needed medication.   If a prior authorization is required to get your medication covered by your insurance company,  please allow [place] 1-2 business days to complete this process.  Drug prices often vary depending on where the prescription is filled and some pharmacies may offer cheaper prices.  The website www.goodrx.com contains coupons for medications through different pharmacies. The prices here do not account for what the cost may be with help from insurance (it may be cheaper with your insurance), but the website can give you the price if you did not use any insurance.  - You can print the associated coupon and take it with your prescription to the pharmacy.  - You may also stop by our office during regular business hours and pick up a GoodRx coupon card.  - If you need your prescription sent electronically to a different pharmacy, notify our office through [facility] or by phone at [phone]

## 2023-08-02 NOTE — Progress Notes (Signed)
New Patient Visit   Subjective  Brianna Odom is a 57 y.o. female who presents for the following: Skin Cancer Screening and Full Body Skin Exam  The patient presents for Total-Body Skin Exam (TBSE) for skin cancer screening and mole check. The patient has spots, moles and lesions to be evaluated, some may be new or changing and the patient may have concern these could be cancer.  No personal or fhx skin cancer. Patient is concerned about possible rosacea. Advises it started about 1 month ago. She is itchy around nose, face is redder when washing. No new products, she is using Vanicream, advises she is very sterile with make up and brushes. She has been on an estrogen patch for years.   Patient advises she does keloid very easily.   The following portions of the chart were reviewed this encounter and updated as appropriate: medications, allergies, medical history  Review of Systems:  No other skin or systemic complaints except as noted in HPI or Assessment and Plan.  Objective  Well appearing patient in no apparent distress; mood and affect are within normal limits.  A full examination was performed including scalp, head, eyes, ears, nose, lips, neck, chest, axillae, abdomen, back, buttocks, bilateral upper extremities, bilateral lower extremities, hands, feet, fingers, toes, fingernails, and toenails. All findings within normal limits unless otherwise noted below.   Relevant physical exam findings are noted in the Assessment and Plan.  nasolabial folds, corners of mouth, bilateral nasal creases Pink papules coalescing to plaques at nasolabial folds, corners of mouth, bilateral nasal creases  right mid back Medium brown with dark speckle R/o dysplastic nevus       Assessment & Plan   SKIN CANCER SCREENING PERFORMED TODAY.  ACTINIC DAMAGE - Chronic condition, secondary to cumulative UV/sun exposure - diffuse scaly erythematous macules with underlying dyspigmentation -  Recommend daily broad spectrum sunscreen SPF 30+ to sun-exposed areas, reapply every 2 hours as needed.  - Staying in the shade or wearing long sleeves, sun glasses (UVA+UVB protection) and wide brim hats (4-inch brim around the entire circumference of the hat) are also recommended for sun protection.  - Call for new or changing lesions.  LENTIGINES, SEBORRHEIC KERATOSES, HEMANGIOMAS - Benign normal skin lesions - Benign-appearing - Call for any changes  MELANOCYTIC NEVI - Tan-brown and/or pink-flesh-colored symmetric macules and papules - Benign appearing on exam today - Observation - Call clinic for new or changing moles - Recommend daily use of broad spectrum spf 30+ sunscreen to sun-exposed areas.   Keloidal plaque at right anterior shoulder  Perioral dermatitis nasolabial folds, corners of mouth, bilateral nasal creases  Perioral dermatitis is an eruption which is usually located around the mouth and nose.  It can be a rash and/or red bumps.  It occasionally occurs around the eyes.  It may be itchy and may burn.  The exact cause is unknown.  Some types of makeup, moisturizers, dental products, and prescription creams may be partially responsible for the eruption.  Topical steroids such as cortisone creams can temporarily make the rash better but with discontinuation the rash tends to recur and worsen, so they should be avoided. Topical antibiotics, elidel cream, protopic ointment, and oral antibiotics may be prescribed to treat this condition.  Although perioral dermatitis is not an infection, some antibiotics have anti-inflammatory properties that help it greatly.  Start pimecrolimus twice daily to affected areas May continue azelaic twice daily.  Start doxycycline 100 mg every day with food  Doxycycline should  be taken with food to prevent nausea. Do not lay down for 30 minutes after taking. Be cautious with sun exposure and use good sun protection while on this medication. Pregnant  women should not take this medication.    doxycycline (MONODOX) 100 MG capsule - nasolabial folds, corners of mouth, bilateral nasal creases Take 1 capsule (100 mg total) by mouth daily with supper. Take with food  Neoplasm of uncertain behavior of skin right mid back  Skin / nail biopsy Type of biopsy: tangential   Informed consent: discussed and consent obtained   Timeout: patient name, date of birth, surgical site, and procedure verified   Procedure prep:  Patient was prepped and draped in usual sterile fashion Prep type:  Isopropyl alcohol Anesthesia: the lesion was anesthetized in a standard fashion   Anesthetic:  1% lidocaine w/ epinephrine 1-100,000 buffered w/ 8.4% NaHCO3 Instrument used: DermaBlade   Hemostasis achieved with: pressure, aluminum chloride and electrodesiccation   Outcome: patient tolerated procedure well   Post-procedure details: sterile dressing applied and wound care instructions given   Dressing type: bandage and petrolatum      Return in about 2 months (around 10/02/2023) for perioral dermatitis.  Anise Salvo, RMA, am acting as scribe for [employer], DO .   Documentation: I have reviewed the above documentation for accuracy and completeness, and I agree with the above.  Langston Reusing, DO

## 2023-08-04 LAB — DERMATOLOGY PATHOLOGY

## 2023-08-04 NOTE — Progress Notes (Signed)
Hi Brianna Odom  I reviewed your biopsy results and they showed the spot removed was a little "abnormal" but not cancerous.  No additional treatment is required.  We will continue to monitor the area for re-pigmentation during your annual skin exams. The detailed report is available to view in MyChart.  Have a great day!  Kind Regards,  Dr. Onalee Hua

## 2023-08-09 ENCOUNTER — Encounter: Payer: Self-pay | Admitting: Nurse Practitioner

## 2023-08-09 ENCOUNTER — Ambulatory Visit (INDEPENDENT_AMBULATORY_CARE_PROVIDER_SITE_OTHER): Payer: BC Managed Care – PPO | Admitting: Nurse Practitioner

## 2023-08-09 VITALS — BP 138/90 | HR 79 | Ht 62.0 in | Wt 238.0 lb

## 2023-08-09 DIAGNOSIS — Z01419 Encounter for gynecological examination (general) (routine) without abnormal findings: Secondary | ICD-10-CM

## 2023-08-09 DIAGNOSIS — Z79890 Hormone replacement therapy (postmenopausal): Secondary | ICD-10-CM | POA: Diagnosis not present

## 2023-08-09 MED ORDER — ESTRADIOL 0.025 MG/24HR TD PTTW
1.0000 | MEDICATED_PATCH | TRANSDERMAL | 3 refills | Status: DC
Start: 2023-08-09 — End: 2024-08-10

## 2023-08-09 NOTE — Progress Notes (Signed)
Nahlah Sivils 08-Sep-1966 [id number]   History:  57 y.o. G2P2002 presents for annual exam. Postmenopausal - on ERT for vasomotor symptoms with good management. Normal pap history. S/P 2002 TVH for benign reasons. HTN, anxiety managed by PCP.   Gynecologic History No LMP recorded. Patient has had a hysterectomy.   Contraception/Family planning: status post hysterectomy Sexually active: Yes  Health Maintenance Last Pap: No longer screening Last mammogram: 06/28/2023. Results were: Normal Last colonoscopy: 04/15/2016. Results were: Normal, 10-year recall Last Dexa: Not indicated  Past medical history, past surgical history, family history and social history were all reviewed and documented in the EPIC chart. 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 Daughter and son in [place].   ROS:  A ROS was performed and pertinent positives and negatives are included.  Exam:  Vitals:   08/09/23 1134  BP: (!) 138/90  Pulse: 79  SpO2: 96%  Weight: 238 lb (108 kg)  Height: 5\' 2"  (1.575 m)   Body mass index is 43.53 kg/m.  General appearance:  Normal Thyroid:  Symmetrical, normal in size, without palpable masses or nodularity. Respiratory  Auscultation:  Clear without wheezing or rhonchi Cardiovascular  Auscultation:  Regular rate, without rubs, murmurs or gallops  Edema/varicosities:  Not grossly evident Abdominal  Soft,nontender, without masses, guarding or rebound.  Liver/spleen:  No organomegaly noted  Hernia:  None appreciated  Skin  Inspection:  Grossly normal Breasts: Examined lying and sitting.   Right: Without masses, retractions, nipple discharge or axillary adenopathy.   Left: Without masses, retractions, nipple discharge or axillary adenopathy. Pelvic: External genitalia:  no lesions              Urethra:  normal appearing urethra with no masses, tenderness or lesions              Bartholins and Skenes: normal                 Vagina: normal appearing vagina with normal color and discharge, no  lesions              Cervix: Absent Bimanual Exam:  Uterus: Absent              Adnexa: no mass, fullness, tenderness              Rectovaginal: Deferred              Anus:  normal sphincter tone, no lesions   Patient informed chaperone available to be present for breast and pelvic exam. Patient has requested no chaperone to be present. Patient has been advised what will be completed during breast and pelvic exam.   Assessment/Plan:  57 y.o. G2P2002 for annual exam.   Well female exam with routine gynecological exam - Education provided on SBEs, importance of preventative screenings, current guidelines, high calcium diet, regular exercise, and multivitamin daily.  Labs with PCP.   Postmenopausal hormone therapy - Plan: estradiol (VIVELLE-DOT) 0.025 MG/24HR twice weekly with good management. Aware of risks with continued use.   Screening for cervical cancer - Normal Pap history.  No longer screening per guidelines.   Screening for breast cancer - Normal mammogram history.  Continue annual screenings.  Normal breast exam today.  Screening for colon cancer - 2017 colonoscopy. Will repeat at 10-year interval per GI's recommendation.   Screening for osteoporosis - Average risk. Will plan DXA at age 57.   Return in 1 year for annual or sooner if needed.     Albin Duckett Sylvie Farrier DNP, 11:58 AM  08/09/2023

## 2023-08-18 ENCOUNTER — Other Ambulatory Visit: Payer: Self-pay

## 2023-08-18 DIAGNOSIS — R7303 Prediabetes: Secondary | ICD-10-CM

## 2023-08-18 DIAGNOSIS — I10 Essential (primary) hypertension: Secondary | ICD-10-CM

## 2023-08-23 ENCOUNTER — Other Ambulatory Visit: Payer: BC Managed Care – PPO

## 2023-08-23 DIAGNOSIS — R7303 Prediabetes: Secondary | ICD-10-CM

## 2023-08-23 DIAGNOSIS — I10 Essential (primary) hypertension: Secondary | ICD-10-CM

## 2023-08-24 LAB — COMPREHENSIVE METABOLIC PANEL
ALT: 19 [IU]/L (ref 0–32)
AST: 15 [IU]/L (ref 0–40)
Albumin: 4.4 g/dL (ref 3.8–4.9)
Alkaline Phosphatase: 107 [IU]/L (ref 44–121)
BUN/Creatinine Ratio: 19 (ref 9–23)
BUN: 13 mg/dL (ref 6–24)
Bilirubin Total: 0.3 mg/dL (ref 0.0–1.2)
CO2: 23 mmol/L (ref 20–29)
Calcium: 8.9 mg/dL (ref 8.7–10.2)
Chloride: 103 mmol/L (ref 96–106)
Creatinine, Ser: 0.68 mg/dL (ref 0.57–1.00)
Globulin, Total: 2.1 g/dL (ref 1.5–4.5)
Glucose: 94 mg/dL (ref 70–99)
Potassium: 4 mmol/L (ref 3.5–5.2)
Sodium: 140 mmol/L (ref 134–144)
Total Protein: 6.5 g/dL (ref 6.0–8.5)
eGFR: 100 mL/min/{1.73_m2} (ref >59)

## 2023-08-24 LAB — LIPID PANEL
Chol/HDL Ratio: 3.1 {ratio} (ref 0.0–4.4)
Cholesterol, Total: 196 mg/dL (ref 100–199)
HDL: 64 mg/dL (ref >39)
LDL Chol Calc (NIH): 115 mg/dL — ABNORMAL HIGH (ref 0–99)
Triglycerides: 97 mg/dL (ref 0–149)
VLDL Cholesterol Cal: 17 mg/dL (ref 5–40)

## 2023-08-24 LAB — CBC WITH DIFFERENTIAL/PLATELET
Basophils Absolute: 0 10*3/uL (ref 0.0–0.2)
Basos: 1 %
EOS (ABSOLUTE): 0.1 10*3/uL (ref 0.0–0.4)
Eos: 2 %
Hematocrit: 40.5 % (ref 34.0–46.6)
Hemoglobin: 13.2 g/dL (ref 11.1–15.9)
Immature Grans (Abs): 0 10*3/uL (ref 0.0–0.1)
Immature Granulocytes: 0 %
Lymphocytes Absolute: 1.5 10*3/uL (ref 0.7–3.1)
Lymphs: 32 %
MCH: 28.4 pg (ref 26.6–33.0)
MCHC: 32.6 g/dL (ref 31.5–35.7)
MCV: 87 fL (ref 79–97)
Monocytes Absolute: 0.4 10*3/uL (ref 0.1–0.9)
Monocytes: 9 %
Neutrophils Absolute: 2.8 10*3/uL (ref 1.4–7.0)
Neutrophils: 56 %
Platelets: 253 10*3/uL (ref 150–450)
RBC: 4.64 x10E6/uL (ref 3.77–5.28)
RDW: 12.7 % (ref 11.7–15.4)
WBC: 4.9 10*3/uL (ref 3.4–10.8)

## 2023-08-24 LAB — HEMOGLOBIN A1C
Est. average glucose Bld gHb Est-mCnc: 128 mg/dL
Hgb A1c MFr Bld: 6.1 % — ABNORMAL HIGH (ref 4.8–5.6)

## 2023-08-30 ENCOUNTER — Encounter: Payer: Self-pay | Admitting: Family Medicine

## 2023-08-30 ENCOUNTER — Ambulatory Visit (INDEPENDENT_AMBULATORY_CARE_PROVIDER_SITE_OTHER): Payer: BC Managed Care – PPO | Admitting: Family Medicine

## 2023-08-30 VITALS — BP 138/80 | HR 82 | Resp 18 | Ht 62.0 in | Wt 242.0 lb

## 2023-08-30 DIAGNOSIS — R7303 Prediabetes: Secondary | ICD-10-CM | POA: Diagnosis not present

## 2023-08-30 DIAGNOSIS — I10 Essential (primary) hypertension: Secondary | ICD-10-CM | POA: Diagnosis not present

## 2023-08-30 DIAGNOSIS — F411 Generalized anxiety disorder: Secondary | ICD-10-CM

## 2023-08-30 DIAGNOSIS — Z1159 Encounter for screening for other viral diseases: Secondary | ICD-10-CM

## 2023-08-30 DIAGNOSIS — Z0000 Encounter for general adult medical examination without abnormal findings: Secondary | ICD-10-CM

## 2023-08-30 DIAGNOSIS — E7841 Elevated Lipoprotein(a): Secondary | ICD-10-CM

## 2023-08-30 MED ORDER — VALSARTAN 160 MG PO TABS
160.0000 mg | ORAL_TABLET | Freq: Every day | ORAL | 1 refills | Status: DC
Start: 2023-08-30 — End: 2023-12-21

## 2023-08-30 MED ORDER — CITALOPRAM HYDROBROMIDE 20 MG PO TABS
20.0000 mg | ORAL_TABLET | Freq: Every day | ORAL | 1 refills | Status: DC
Start: 2023-08-30 — End: 2024-05-04

## 2023-08-30 MED ORDER — AMLODIPINE BESYLATE 5 MG PO TABS: 5.0000 mg | ORAL_TABLET | Freq: Every day | ORAL | 1 refills | Status: DC

## 2023-08-30 MED ORDER — HYDROCHLOROTHIAZIDE 25 MG PO TABS: 25.0000 mg | ORAL_TABLET | Freq: Every day | ORAL | 1 refills | Status: AC

## 2023-08-30 NOTE — Assessment & Plan Note (Signed)
Last lipid panel: LDL 115, HDL 64, triglycerides 97.  Encouraged routine physical activity and limiting carbs/sugar and trans and saturated fats.  Will continue to monitor.

## 2023-08-30 NOTE — Progress Notes (Signed)
Complete physical exam  Patient: Brianna Odom   DOB: 08-Feb-1966   57 y.o. Female  MRN: [id number]  Subjective:    Chief Complaint  Patient presents with   Annual Exam    Brianna Odom is a 57 y.o. female who presents today for a complete physical exam. She reports consuming a general diet.  She generally feels well. She reports sleeping well. She does not have additional problems to discuss today.  She recently went on a family trip to [place] and absolutely loved it.   Most recent fall risk assessment:    08/09/2023   11:34 AM  Fall Risk   Falls in the past year? 0  Number falls in past yr: 0  Injury with Fall? 0     Most recent depression and anxiety screenings:    02/22/2023   10:48 AM 06/16/2022    2:33 PM  PHQ 2/9 Scores  PHQ - 2 Score 0   PHQ- 9 Score 0   Exception Documentation  Patient refusal      02/22/2023   10:49 AM 05/04/2022    2:11 PM 11/03/2021   11:28 AM 08/04/2021    1:17 PM  GAD 7 : Generalized Anxiety Score  Nervous, Anxious, on Edge 0 0 0 0  Control/stop worrying 0 0 0 0  Worry too much - different things 0 0 0 0  Trouble relaxing 0 0 0 0  Restless 0 0 0 0  Easily annoyed or irritable 0 0 0 0  Afraid - awful might happen 0 0 0 0  Total GAD 7 Score 0 0 0 0  Anxiety Difficulty Not difficult at all Not difficult at all Not difficult at all     Patient Active Problem List   Diagnosis Date Noted   Bilateral leg pain 02/22/2023   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   Menopausal hot flushes 10/31/2018   Keloid of skin 06/27/2018   S/P hysterectomy 09/06/2017   Prediabetes 09/06/2017   Plantar fascial fibromatosis of left foot 09/06/2017   IFG (impaired fasting glucose) 01/13/2017   Morbid obesity with BMI of 40.0-44.9, adult (HCC) 01/13/2017   Essential hypertension 01/04/2016   Vitamin D deficiency 10/15/2010   Generalized anxiety disorder 09/17/2010    Past Surgical History:  Procedure Laterality Date   BREAST BIOPSY  1981   ROTATOR CUFF  REPAIR Left 11/2018   TONSILLECTOMY     TOTAL VAGINAL HYSTERECTOMY  2002   Social History   Tobacco Use   Smoking status: Never    Passive exposure: Never   Smokeless tobacco: Never  Vaping Use   Vaping status: Never Used  Substance Use Topics   Alcohol use: Yes    Alcohol/week: 1.0 - 2.0 standard drink of alcohol    Types: 1 - 2 Glasses of wine per week   Drug use: No   Family History  Problem Relation Age of Onset   Diabetes Father        pre   Hypertension Father    Allergies  Allergen Reactions   Diclofenac Diarrhea    Abdominal cramping.     Patient Care Team: Melida Quitter, PA as PCP - General (Family Medicine) Stanton Kidney, MD as Consulting Physician (Gastroenterology) Regal, Kirstie Peri, DPM as Consulting Physician (Podiatry) Head, Irineo Axon, PT (Physical Therapy) Sherian Rein, MD as Consulting Physician (Obstetrics and Gynecology)   Outpatient Medications Prior to Visit  Medication Sig   Coenzyme Q10 (COQ10) 100 MG CAPS Take 1 capsule by  mouth daily.   doxycycline (MONODOX) 100 MG capsule Take 1 capsule (100 mg total) by mouth daily with supper. Take with food   estradiol (VIVELLE-DOT) 0.025 MG/24HR Place 1 patch onto the skin 2 (two) times a week.   Multiple Vitamin (MULTIVITAMIN) capsule Take 1 capsule by mouth daily.   pimecrolimus (ELIDEL) 1 % cream Apply topically 2 (two) times daily.   Probiotic Product (PROBIOTIC DAILY PO) Take 1 tablet by mouth daily. As needed   Turmeric 500 MG CAPS Take 2 capsules by mouth daily.   [DISCONTINUED] amLODipine (NORVASC) 5 MG tablet TAKE 1 TABLET (5 MG TOTAL) BY MOUTH DAILY. TAKE WITH VALSARTAN AND HYDROCHLOROTHIAZIDE. COMBINATION PILL WAS NOT AVAILABLE.   [DISCONTINUED] citalopram (CELEXA) 20 MG tablet TAKE 1 TABLET (20 MG TOTAL) BY MOUTH DAILY.   [DISCONTINUED] hydrochlorothiazide (HYDRODIURIL) 25 MG tablet TAKE 1 TABLET (25 MG TOTAL) BY MOUTH DAILY. WITH AMLODIPINE AND VALSARTAN. COMBINATION PILL IS NOT  AVAILABLE.   [DISCONTINUED] valsartan (DIOVAN) 160 MG tablet TAKE 1 TABLET (160 MG TOTAL) BY MOUTH DAILY. TAKE WITH AMLODIPINE AND HYDROCHLOROTHIAZIDE. COMBINATION PILL WAS NOT AVAILABLE.   No facility-administered medications prior to visit.    Review of Systems  Constitutional:  Negative for chills, fever and malaise/fatigue.  HENT:  Negative for congestion and hearing loss.   Eyes:  Negative for blurred vision and double vision.  Respiratory:  Negative for cough and shortness of breath.   Cardiovascular:  Negative for chest pain, palpitations and leg swelling.  Gastrointestinal:  Negative for abdominal pain, constipation, diarrhea and heartburn.  Genitourinary:  Negative for frequency and urgency.  Musculoskeletal:  Negative for myalgias and neck pain.  Neurological:  Negative for headaches.  Endo/Heme/Allergies:  Negative for polydipsia.  Psychiatric/Behavioral:  Negative for depression. The patient is not nervous/anxious and does not have insomnia.       Objective:    BP 138/80 (BP Location: Left Arm, Patient Position: Sitting, Cuff Size: Large)   Pulse 82   Resp 18   Ht 5\' 2"  (1.575 m)   Wt 242 lb (109.8 kg)   SpO2 98%   BMI 44.26 kg/m    Physical Exam Constitutional:      General: She is not in acute distress.    Appearance: Normal appearance.  HENT:     Head: Normocephalic and atraumatic.     Right Ear: Tympanic membrane, ear canal and external ear normal. There is no impacted cerumen.     Left Ear: Tympanic membrane, ear canal and external ear normal. There is no impacted cerumen.     Nose: Nose normal.     Mouth/Throat:     Mouth: Mucous membranes are moist.     Pharynx: No oropharyngeal exudate or posterior oropharyngeal erythema.  Eyes:     Extraocular Movements: Extraocular movements intact.     Conjunctiva/sclera: Conjunctivae normal.     Pupils: Pupils are equal, round, and reactive to light.     Comments: Does not wear glasses or contacts  Neck:      Thyroid: No thyroid mass, thyromegaly or thyroid tenderness.  Cardiovascular:     Rate and Rhythm: Normal rate and regular rhythm.     Heart sounds: Normal heart sounds. No murmur heard.    No friction rub. No gallop.  Pulmonary:     Effort: Pulmonary effort is normal. No respiratory distress.     Breath sounds: Normal breath sounds. No wheezing, rhonchi or rales.  Abdominal:     General: Abdomen is flat. Bowel sounds  are normal. There is no distension.     Palpations: There is no mass.     Tenderness: There is no abdominal tenderness. There is no guarding.  Musculoskeletal:        General: Normal range of motion.     Cervical back: Normal range of motion and neck supple.  Lymphadenopathy:     Cervical: No cervical adenopathy.  Skin:    General: Skin is warm and dry.  Neurological:     Mental Status: She is alert and oriented to person, place, and time.     Cranial Nerves: No cranial nerve deficit.     Motor: No weakness.     Deep Tendon Reflexes: Reflexes normal.  Psychiatric:        Mood and Affect: Mood normal.       Assessment & Plan:    Routine Health Maintenance and Physical Exam  Immunization History  Administered Date(s) Administered   Influenza Inj Mdck Quad Pf 09/28/2016, 09/12/2018   Influenza, Seasonal, Injecte, Preservative Fre 01/13/2016, 09/28/2016   Influenza,inj,Quad PF,6+ Mos 11/06/2019   Influenza-Unspecified 09/28/2016, 08/19/2017   Tdap 05/04/2022    Health Maintenance  Topic Date Due   COVID-19 Vaccine (1) Never done   HIV Screening  Never done   Hepatitis C Screening  Never done   Zoster Vaccines- Shingrix (1 of 2) Never done   INFLUENZA VACCINE  02/21/2024 (Originally 06/24/2023)   MAMMOGRAM  06/27/2025   Colonoscopy  04/15/2026   DTaP/Tdap/Td (2 - Td or Tdap) 05/04/2032   HPV VACCINES  Aged Out    Reviewed most recent labs including CBC, CMP, lipid panel, A1C, TSH, and vitamin D. All within normal limits/stable from last check other than  A1c stable at 6.1, LDL 115. She is interested in shingles vaccine, plans on getting it at the pharmacy on a Saturday.  Discussed health benefits of physical activity, and encouraged her to engage in regular exercise appropriate for her age and condition.  Wellness examination  Essential hypertension Assessment & Plan: BP goal <140/90.  Stable, at goal.  Continue amlodipine 5 mg daily, hydrochlorothiazide 25 mg daily, valsartan 160 mg daily.  Encouraged low-sodium diet and physical activity.  Will continue to monitor.  CMP within normal limits, repeat in 6 months.  Orders: -     amLODIPine Besylate; Take 1 tablet (5 mg total) by mouth daily. Take with valsartan and hydrochlorothiazide. Combination pill was not available.  Dispense: 90 tablet; Refill: 1 -     hydroCHLOROthiazide; Take 1 tablet (25 mg total) by mouth daily. With amlodipine and valsartan.  Combination pill is not available.  Dispense: 90 tablet; Refill: 1 -     Valsartan; Take 1 tablet (160 mg total) by mouth daily. Take with amlodipine and hydrochlorothiazide.  Combination pill was not available.  Dispense: 90 tablet; Refill: 1  Generalized anxiety disorder Assessment & Plan: PHQ-9 score 0, GAD-7 score 0, stable.  Continue Celexa 20 mg daily.  Will continue to monitor.  Orders: -     Citalopram Hydrobromide; Take 1 tablet (20 mg total) by mouth daily.  Dispense: 90 tablet; Refill: 1  Prediabetes Assessment & Plan: A1c stable at 6.1.  Encouraged routine physical activity and limiting simple carbs/sugary foods.   Elevated lipoprotein(a) Assessment & Plan: Last lipid panel: LDL 115, HDL 64, triglycerides 97.  Encouraged routine physical activity and limiting carbs/sugar and trans and saturated fats.  Will continue to monitor.     Return in about 6 months (around 02/28/2024) for  follow-up for HTN, HLD, prediabetes, fasting blood work 1 week before.     Melida Quitter, PA

## 2023-08-30 NOTE — Assessment & Plan Note (Signed)
BP goal <140/90.  Stable, at goal.  Continue amlodipine 5 mg daily, hydrochlorothiazide 25 mg daily, valsartan 160 mg daily.  Encouraged low-sodium diet and physical activity.  Will continue to monitor.  CMP within normal limits, repeat in 6 months.

## 2023-08-30 NOTE — Assessment & Plan Note (Signed)
PHQ-9 score 0, GAD-7 score 0, stable.  Continue Celexa 20 mg daily.  Will continue to monitor.

## 2023-08-30 NOTE — Patient Instructions (Signed)
The shingles shot is covered by [employer]!  You can have that done at the pharmacy whenever it is a good time for you.

## 2023-08-30 NOTE — Assessment & Plan Note (Signed)
A1c stable at 6.1.  Encouraged routine physical activity and limiting simple carbs/sugary foods.

## 2023-10-04 ENCOUNTER — Encounter: Payer: Self-pay | Admitting: Dermatology

## 2023-10-04 ENCOUNTER — Ambulatory Visit (INDEPENDENT_AMBULATORY_CARE_PROVIDER_SITE_OTHER): Payer: BC Managed Care – PPO | Admitting: Dermatology

## 2023-10-04 VITALS — BP 149/94 | HR 81

## 2023-10-04 DIAGNOSIS — L710 Perioral dermatitis: Secondary | ICD-10-CM

## 2023-10-04 DIAGNOSIS — Z79899 Other long term (current) drug therapy: Secondary | ICD-10-CM | POA: Diagnosis not present

## 2023-10-04 MED ORDER — DOXYCYCLINE HYCLATE 100 MG PO TABS
100.0000 mg | ORAL_TABLET | Freq: Two times a day (BID) | ORAL | 2 refills | Status: DC
Start: 2023-10-04 — End: 2023-12-21

## 2023-10-04 NOTE — Progress Notes (Signed)
   Follow-Up Visit   Subjective  Brianna Odom is a 57 y.o. female who presents for the following: Perioral Dermatitis  Patient present today for follow up visit for Perioral Dermatitis. Patient was last evaluated on 08/02/23. Patient reports sxs are better. Patient denies medication changes. At her previous visit patient recommended to start pimecrolimus twice daily to affected areas,May continue azelaic twice daily & start doxycycline 100 mg every day with food.  The following portions of the chart were reviewed this encounter and updated as appropriate: medications, allergies, medical history  Review of Systems:  No other skin or systemic complaints except as noted in HPI or Assessment and Plan.  Objective  Well appearing patient in no apparent distress; mood and affect are within normal limits.  ess otherwise noted below.   A focused examination was performed of the following areas: Face  Relevant exam findings are noted in the Assessment and Plan.    Assessment & Plan   Perioral dermatitis Exam: Areas have resolved nasolabial folds, corners of mouth, bilateral nasal creases   Perioral dermatitis is an eruption which is usually located around the mouth and nose.  It can be a rash and/or red bumps.  It occasionally occurs around the eyes.  It may be itchy and may burn.  The exact cause is unknown.  Some types of makeup, moisturizers, dental products, and prescription creams may be partially responsible for the eruption.  Topical steroids such as cortisone creams can temporarily make the rash better but with discontinuation the rash tends to recur and worsen, so they should be avoided. Topical antibiotics, elidel cream, protopic ointment, and oral antibiotics may be prescribed to treat this condition.  Although perioral dermatitis is not an infection, some antibiotics have anti-inflammatory properties that help it greatly.   Treatment Plan: - Recommended if she experiencing another  flare to resume pimecrolimus twice daily to affected areas until areas clear - Recommended continue azelaic twice daily - We will plan to refill doxycycline 100  mg to have on hand  Perioral dermatitis  Related Medications doxycycline (MONODOX) 100 MG capsule Take 1 capsule (100 mg total) by mouth daily with supper. Take with food  doxycycline (VIBRA-TABS) 100 MG tablet Take 1 tablet (100 mg total) by mouth 2 (two) times daily. TAKE WITH HEAVY MEAL    Return in about 6 months (around 04/02/2024) for Perioral Dermatitis F/U.    Documentation: I have reviewed the above documentation for accuracy and completeness, and I agree with the above.  Brianna Odom, am acting as scribe for Brianna Reusing, DO.  Brianna Reusing, DO

## 2023-10-04 NOTE — Patient Instructions (Addendum)

## 2023-10-26 ENCOUNTER — Encounter: Payer: Self-pay | Admitting: Family Medicine

## 2023-10-26 ENCOUNTER — Ambulatory Visit: Payer: BC Managed Care – PPO | Admitting: Family Medicine

## 2023-10-26 VITALS — BP 122/78 | HR 88 | Resp 18 | Ht 62.0 in | Wt 243.0 lb

## 2023-10-26 DIAGNOSIS — R7301 Impaired fasting glucose: Secondary | ICD-10-CM

## 2023-10-26 DIAGNOSIS — I10 Essential (primary) hypertension: Secondary | ICD-10-CM

## 2023-10-26 DIAGNOSIS — G8929 Other chronic pain: Secondary | ICD-10-CM

## 2023-10-26 DIAGNOSIS — E7841 Elevated Lipoprotein(a): Secondary | ICD-10-CM

## 2023-10-26 DIAGNOSIS — Z6841 Body Mass Index (BMI) 40.0 and over, adult: Secondary | ICD-10-CM

## 2023-10-26 DIAGNOSIS — R1310 Dysphagia, unspecified: Secondary | ICD-10-CM | POA: Diagnosis not present

## 2023-10-26 DIAGNOSIS — M25562 Pain in left knee: Secondary | ICD-10-CM | POA: Diagnosis not present

## 2023-10-26 DIAGNOSIS — R7303 Prediabetes: Secondary | ICD-10-CM

## 2023-10-26 MED ORDER — WEGOVY 0.25 MG/0.5ML ~~LOC~~ SOAJ: 0.2500 mg | SUBCUTANEOUS | 0 refills | Status: DC

## 2023-10-26 NOTE — Patient Instructions (Signed)
You should get a call to schedule the neck ultrasound, please let me know if you have not heard anything from them by the end of the week.

## 2023-10-26 NOTE — Progress Notes (Signed)
Acute Office Visit  Subjective:     Patient ID: Brianna Odom, female    DOB: 03-04-1966, 57 y.o.   MRN: [id number]  Chief Complaint  Patient presents with   Thyroid Nodule    Noticed several weeks ago   Weight Management Screening   Knee Pain    Left     Knee Pain    Patient is in today for several complaints.  For the last 2 weeks or so, she has noticed a "nodule" on her anterior neck that becomes evident only sometimes when she swallows.  It feels more like a pressure on the right side of her throat when swallowing, she describes it as "like a chip getting stuck in your throat but not a sharp sensation".  She denies any pain, difficulty swallowing, URI symptoms, chest pain, cough.  She would also like to discuss GLP-1 injections.  These were initially discussed 8 months ago, but her insurance does not cover GLP-1 medications for weight management.  Additionally, her left knee continues to cause her pain and she needs a referral to [facility] again.  ROS See HPI    Objective:    BP 122/78 (BP Location: Left Arm, Patient Position: Sitting, Cuff Size: Large)   Pulse 88   Resp 18   Ht 5\' 2"  (1.575 m)   Wt 243 lb (110.2 kg)   SpO2 97%   BMI 44.45 kg/m   Physical Exam Constitutional:      General: She is not in acute distress.    Appearance: Normal appearance.  HENT:     Head: Normocephalic and atraumatic.  Neck:     Thyroid: No thyroid mass, thyromegaly or thyroid tenderness.     Trachea: Trachea normal. No tracheal deviation.     Comments: Rotating head to her left is slightly more limited than rotating her head to the right.  Sternocleidomastoid on the right with constant spasm palpable even when not flexing the muscle during head rotation, SCM on left only palpable when turning head.  No palpable nodule or lymphadenopathy. Cardiovascular:     Rate and Rhythm: Normal rate and regular rhythm.     Heart sounds: No murmur heard.    No friction rub. No gallop.  Pulmonary:      Effort: Pulmonary effort is normal. No respiratory distress.     Breath sounds: No wheezing, rhonchi or rales.  Musculoskeletal:     Cervical back: Normal range of motion and neck supple. No pain with movement.  Lymphadenopathy:     Cervical: No cervical adenopathy.  Skin:    General: Skin is warm and dry.  Neurological:     Mental Status: She is alert and oriented to person, place, and time.      Assessment & Plan:  Pain on swallowing -     US SOFT TISSUE HEAD & NECK (NON-THYROID); Future  Chronic pain of left knee -     Ambulatory referral to Orthopedic Surgery  Morbid obesity with BMI of 40.0-44.9, adult (HCC) -     HYQMVH; Inject 0.25 mg into the skin once a week. Use this dose for 1 month (4 shots) and then increase to next higher dose.  Dispense: 2 mL; Refill: 0  IFG (impaired fasting glucose) -     QIONGE; Inject 0.25 mg into the skin once a week. Use this dose for 1 month (4 shots) and then increase to next higher dose.  Dispense: 2 mL; Refill: 0  Prediabetes -  Wegovy; Inject 0.25 mg into the skin once a week. Use this dose for 1 month (4 shots) and then increase to next higher dose.  Dispense: 2 mL; Refill: 0  Essential hypertension -     UJWJXB; Inject 0.25 mg into the skin once a week. Use this dose for 1 month (4 shots) and then increase to next higher dose.  Dispense: 2 mL; Refill: 0  Elevated lipoprotein(a) -     JYNWGN; Inject 0.25 mg into the skin once a week. Use this dose for 1 month (4 shots) and then increase to next higher dose.  Dispense: 2 mL; Refill: 0  Discussed that the pressure on swallowing would benefit from noninvasive imaging with ultrasound, high suspicion for sternocleidomastoid spasm contributing to symptoms.  Starting with symptomatic management of gentle stretching and heat while awaiting ultrasound results.  Referral made to orthopedic surgery for left knee pain.  We discussed that it is unlikely that her insurance plan has changed  its criteria, but she would like to move forward with trying to start [facility].  Patient would benefit from [facility] and weight loss for hypertension, prediabetes, hyperlipidemia, and current BMI 44.45.  Return in about 2 months (around 12/27/2023) for follow-up for weight management.  Melida Quitter, PA

## 2023-11-02 ENCOUNTER — Other Ambulatory Visit: Payer: BC Managed Care – PPO

## 2023-12-21 ENCOUNTER — Ambulatory Visit: Payer: Self-pay | Admitting: Family Medicine

## 2023-12-21 ENCOUNTER — Ambulatory Visit
Admission: EM | Admit: 2023-12-21 | Discharge: 2023-12-21 | Disposition: A | Discharge status: Home / Self Care | Payer: 59 | Attending: Physician Assistant | Admitting: Physician Assistant

## 2023-12-21 DIAGNOSIS — L03113 Cellulitis of right upper limb: Secondary | ICD-10-CM | POA: Diagnosis not present

## 2023-12-21 MED ORDER — DOXYCYCLINE HYCLATE 100 MG PO CAPS: 100.0000 mg | ORAL_CAPSULE | Freq: Two times a day (BID) | ORAL | 0 refills | Status: AC

## 2023-12-21 NOTE — Telephone Encounter (Signed)
Copied from CRM [id number]. Topic: Clinical - Red Word Triage >> Dec 21, 2023  2:20 PM Geroge Baseman wrote: Red Word that prompted transfer to Nurse Triage: Red swollen arm, thinks maybe a bug bite, itchy and painful, been taking antihistamines and wants advice   Chief Complaint: Right arm swelling Symptoms: swelling, redness, itching Frequency: since yesterday morning Pertinent Negatives: Patient denies fever, swelling of mouth/tongue/throat, chest pain, nausea, vomiting, injury Disposition: [x] ED /[] Urgent Care (no appt availability in office) / [] Appointment(In office/virtual)/ []  [facility]/ [] Home Care/ [] Refused Recommended Disposition /[]  Mobile Bus/ []  Follow-up with PCP Additional Notes: Patient called and advised that she woke up yesterday morning and her right arm felt warm.  She states that it was warm and swollen.  It is itchy, swollen and patient has taken benedryl.  She states that it is still about the same as it was yesterday.  She is unable to find an area that looks like where she was bitten.  She denies any pain at all, fever, any known injury, swelling of mouth/tongue/throat, chest pain, nausea, or vomiting.  Patient states that the swelling is all the way around her arm and she has never had swelling like this before.  She did mention concern for a possible blood clot.  Due to the swelling/redness being almost patient's entire arm, not getting better with antihistamines, and patient unable to localize a specific area that might be a bug bite---patient is advised that it is recommended that she goes to the ER to be assessed at this time.  Patient is advised that if things get worse, she can call 911 as well.  Patient verbalized understanding and states she will get it checked out immediately.  Reason for Disposition  SEVERE arm swelling (e.g., all of arm looks swollen)  Answer Assessment - Initial Assessment Questions 1. ONSET: "When did the swelling start?"  (e.g., minutes, hours, days)     yesterday 2. LOCATION: "What part of the arm is swollen?"  "Are both arms swollen or just one arm?"     Right arm from maybe mid forearm up to 3 inches before shoulder 3. SEVERITY: "How bad is the swelling?" (e.g., localized; mild, moderate, severe)   - LOCALIZED: Small area of puffiness or swelling on just one arm   - JOINT SWELLING: Swelling of one joint   - MILD: Puffiness or swelling of hand   - MODERATE: Puffiness or swollen feeling of entire arm    - SEVERE: All of arm looks swollen; pitting edema     moderate 4. REDNESS: "Does the swelling look red or infected?"     Red 5. PAIN: "Is the swelling painful to touch?" If Yes, ask: "How painful is it?"   (Scale 1-10; mild, moderate or severe)     No 6. FEVER: "Do you have a fever?" If Yes, ask: "What is it, how was it measured, and when did it start?"      No 7. CAUSE: "What do you think is causing the arm swelling?"     Unknown 8. MEDICAL HISTORY: "Do you have a history of heart failure, kidney disease, liver failure, or cancer?"     No 9. RECURRENT SYMPTOM: "Have you had arm swelling before?" If Yes, ask: "When was the last time?" "What happened that time?"     No 10. OTHER SYMPTOMS: "Do you have any other symptoms?" (e.g., chest pain, difficulty breathing)       No  Protocols used: Arm Swelling and  Edema-A-AH

## 2023-12-21 NOTE — ED Triage Notes (Signed)
Upon awakening yesterday morning, pt noticed swelling and mild redness in R arm from mid-bicep to wrist with clear delineation. No pain.  Mild itching.  Not worsening since yesterday but also not improving with hydrocortisone or benadryl q6h. No SOB or CP.

## 2023-12-22 ENCOUNTER — Other Ambulatory Visit: Payer: Self-pay

## 2023-12-22 ENCOUNTER — Emergency Department (HOSPITAL_BASED_OUTPATIENT_CLINIC_OR_DEPARTMENT_OTHER)
Admission: EM | Admit: 2023-12-22 | Discharge: 2023-12-22 | Disposition: A | Discharge status: Home / Self Care | Payer: 59 | Attending: Emergency Medicine | Admitting: Emergency Medicine

## 2023-12-22 ENCOUNTER — Encounter (HOSPITAL_BASED_OUTPATIENT_CLINIC_OR_DEPARTMENT_OTHER): Payer: Self-pay | Admitting: Emergency Medicine

## 2023-12-22 ENCOUNTER — Emergency Department (HOSPITAL_BASED_OUTPATIENT_CLINIC_OR_DEPARTMENT_OTHER): Payer: 59

## 2023-12-22 DIAGNOSIS — M7989 Other specified soft tissue disorders: Secondary | ICD-10-CM | POA: Diagnosis present

## 2023-12-22 DIAGNOSIS — I10 Essential (primary) hypertension: Secondary | ICD-10-CM | POA: Diagnosis not present

## 2023-12-22 DIAGNOSIS — Z79899 Other long term (current) drug therapy: Secondary | ICD-10-CM | POA: Insufficient documentation

## 2023-12-22 DIAGNOSIS — L03113 Cellulitis of right upper limb: Secondary | ICD-10-CM | POA: Diagnosis not present

## 2023-12-22 NOTE — ED Notes (Signed)

## 2023-12-22 NOTE — ED Provider Notes (Signed)
[place] [facility] Provider Note   CSN: [id number] Arrival date & time: 12/22/23  1040     History Chief Complaint  Patient presents with   Arm Swelling    Brianna Odom is a 58 y.o. female.  Patient past history significant for prediabetes, morbid obesity, hypertension presents the emergency department concerns of arm swelling.  States that she has had right arm swelling with some redness for the last 3 days.  Was seen at urgent care yesterday and started on doxycycline for suspected cellulitis.  Patient is concerned for possible blood clot although she has no prior history of DVT, PE, is not on blood thinners.  States that the redness and swelling is somewhat improved after starting the doxycycline.  No systemic symptoms such as fever, chills or bodyaches.  HPI     Home Medications Prior to Admission medications   Medication Sig Start Date End Date Taking? Authorizing Provider  amLODipine (NORVASC) 5 MG tablet Take 1 tablet (5 mg total) by mouth daily. Take with valsartan and hydrochlorothiazide. Combination pill was not available. 08/30/23   Melida Quitter, PA  citalopram (CELEXA) 20 MG tablet Take 1 tablet (20 mg total) by mouth daily. 08/30/23   Melida Quitter, PA  Coenzyme Q10 (COQ10) 100 MG CAPS Take 1 capsule by mouth daily.    [provider]  doxycycline (VIBRAMYCIN) 100 MG capsule Take 1 capsule (100 mg total) by mouth 2 (two) times daily for 7 days. 12/21/23 12/28/23  Tomi Bamberger, PA-C  estradiol (VIVELLE-DOT) 0.025 MG/24HR Place 1 patch onto the skin 2 (two) times a week. 08/09/23   Olivia Mackie, NP  hydrochlorothiazide (HYDRODIURIL) 25 MG tablet Take 1 tablet (25 mg total) by mouth daily. With amlodipine and valsartan.  Combination pill is not available. 08/30/23   Melida Quitter, PA  Multiple Vitamin (MULTIVITAMIN) capsule Take 1 capsule by mouth daily.    [provider]  Turmeric 500 MG CAPS Take 2  capsules by mouth daily.    [provider]      Allergies    Diclofenac    Review of Systems   Review of Systems  Skin:  Positive for color change.       Arm swelling  All other systems reviewed and are negative.   Physical Exam Updated Vital Signs BP 131/88 (BP Location: Left Arm)   Pulse 81   Temp 98 F (36.7 C)   Resp 18   Ht 5\' 2"  (1.575 m)   Wt 104.3 kg   SpO2 98%   BMI 42.07 kg/m  Physical Exam Vitals and nursing note reviewed.  Constitutional:      General: She is not in acute distress.    Appearance: She is well-developed.  HENT:     Head: Normocephalic and atraumatic.  Eyes:     Conjunctiva/sclera: Conjunctivae normal.  Cardiovascular:     Rate and Rhythm: Normal rate and regular rhythm.     Heart sounds: No murmur heard. Pulmonary:     Effort: Pulmonary effort is normal. No respiratory distress.     Breath sounds: Normal breath sounds.  Abdominal:     Palpations: Abdomen is soft.     Tenderness: There is no abdominal tenderness.  Musculoskeletal:        General: Swelling and tenderness present. No deformity or signs of injury. Normal range of motion.     Cervical back: Neck supple.     Comments: RUE with  slight swelling compared to LUE. No edema noted. Erythema also present in right arm but minimal warmth.  Skin:    General: Skin is warm and dry.     Capillary Refill: Capillary refill takes less than 2 seconds.     Findings: Erythema present. No rash.  Neurological:     Mental Status: She is alert.  Psychiatric:        Mood and Affect: Mood normal.     ED Results / Procedures / Treatments   Labs (all labs ordered are listed, but only abnormal results are displayed) Labs Reviewed - No data to display  EKG None  Radiology US Venous Img Upper Right (DVT Study) Result Date: 12/22/2023 CLINICAL DATA:  Right redness and swelling EXAM: RIGHT UPPER EXTREMITY VENOUS DOPPLER ULTRASOUND TECHNIQUE: Gray-scale sonography with graded  compression, as well as color Doppler and duplex ultrasound were performed to evaluate the upper extremity deep venous system from the level of the subclavian vein and including the jugular, axillary, basilic, radial, ulnar and upper cephalic vein. Spectral Doppler was utilized to evaluate flow at rest and with distal augmentation maneuvers. COMPARISON:  None Available. FINDINGS: Contralateral Subclavian Vein: Respiratory phasicity is normal and symmetric with the symptomatic side. No evidence of thrombus. Normal compressibility. Internal Jugular Vein: No evidence of thrombus. Normal compressibility, respiratory phasicity and response to augmentation. Subclavian Vein: No evidence of thrombus. Normal compressibility, respiratory phasicity and response to augmentation. Axillary Vein: No evidence of thrombus. Normal compressibility, respiratory phasicity and response to augmentation. Cephalic Vein: No evidence of thrombus. Normal compressibility, respiratory phasicity and response to augmentation. Basilic Vein: No evidence of thrombus. Normal compressibility, respiratory phasicity and response to augmentation. Brachial Veins: No evidence of thrombus. Normal compressibility, respiratory phasicity and response to augmentation. Radial Veins: No evidence of thrombus. Normal compressibility, respiratory phasicity and response to augmentation. Ulnar Veins: No evidence of thrombus. Normal compressibility, respiratory phasicity and response to augmentation. Venous Reflux:  None visualized. Other Findings:  None visualized. IMPRESSION: No evidence of DVT within the right upper extremity. Electronically Signed   By: Malachy Moan M.D.   On: 12/22/2023 12:30    Procedures Procedures    Medications Ordered in ED Medications - No data to display  ED Course/ Medical Decision Making/ A&P                                 Medical Decision Making  This patient presents to the ED for concern of arm swelling.   Differential diagnosis includes DVT, cellulitis, lymphedema   Imaging Studies ordered:  I ordered imaging studies including [place] of right upper extremity  I independently visualized and interpreted imaging which showed no evidence of DVT I agree with the radiologist interpretation   Problem List / ED Course:  Patient presents to the ED with concerns of arm swelling. States that this has been ongoing for the last 3 days and she first noticed this when waking up one morning. She denies any fever, chills, or bodyaches. Was seen at urgent care yesterday and started on doxycycline for suspected cellulitis. Has only taken one dose. Swelling and warmth improving. Exam is reassuring with good pulses and neurovascularly intact. Mild swelling RUE compared to LUE, but no notable edema. Will obtain US to rule out DVT in RUE. [place] is negative for DVT. Suspect this is likely cellulitis and patient is having good response to doxycycline. Advised patient to continue with course of  antibiotics and finish in entirety. Discussed return precautions such as new or worsening symptoms. Patient otherwise stable and discharged home in good condition.  Final Clinical Impression(s) / ED Diagnoses Final diagnoses:  Cellulitis of right upper extremity    Rx / DC Orders ED Discharge Orders     None         Smitty Knudsen, PA-C 12/22/23 1256    Terrilee Files, MD 12/22/23 [phone]

## 2023-12-22 NOTE — Discharge Instructions (Signed)
You were seen in the ER today for concerns of arm swelling. Your ultrasound was thankfully negative for any signs of a clot in the arm. It appears likely that you do indeed have cellulitis in this arm and I would encourage you to continue taking doxycycline as prescribed. For any new or worsening symptoms, return to the ER. Otherwise please follow up with your primary care provider.

## 2023-12-22 NOTE — ED Triage Notes (Signed)
C/o R arm swelling/redness x 3 days. Been seen at [facility] and dx w/ cellulitis but patient wants to r/o DVT. Arm is warm to touch

## 2023-12-22 NOTE — ED Notes (Addendum)
3 days of swelling, wants eval for DVT.

## 2023-12-22 NOTE — ED Notes (Signed)
Cannot discharge, registration in chart.

## 2023-12-27 ENCOUNTER — Ambulatory Visit: Payer: BC Managed Care – PPO | Admitting: Family Medicine

## 2023-12-29 ENCOUNTER — Encounter: Payer: Self-pay | Admitting: Physician Assistant

## 2023-12-30 ENCOUNTER — Encounter: Payer: Self-pay | Admitting: Family Medicine

## 2024-02-17 ENCOUNTER — Telehealth: Payer: Self-pay | Admitting: *Deleted

## 2024-02-17 NOTE — Telephone Encounter (Signed)
 LVM to call office to see about scheduling a follow up after her labs since her original appt with morgan was cancelled since she is no longer here. Would like to schedule this within a month of labs and if not able then we may need to push labs out to be with in a month of the appointment.

## 2024-02-21 ENCOUNTER — Other Ambulatory Visit: Payer: BC Managed Care – PPO

## 2024-02-21 DIAGNOSIS — R7303 Prediabetes: Secondary | ICD-10-CM

## 2024-02-21 DIAGNOSIS — F411 Generalized anxiety disorder: Secondary | ICD-10-CM

## 2024-02-21 DIAGNOSIS — Z1159 Encounter for screening for other viral diseases: Secondary | ICD-10-CM

## 2024-02-21 DIAGNOSIS — E7841 Elevated Lipoprotein(a): Secondary | ICD-10-CM

## 2024-02-21 DIAGNOSIS — I10 Essential (primary) hypertension: Secondary | ICD-10-CM

## 2024-02-23 LAB — CBC WITH DIFFERENTIAL/PLATELET
Basophils Absolute: 0 10*3/uL (ref 0.0–0.2)
Basos: 1 %
EOS (ABSOLUTE): 0.1 10*3/uL (ref 0.0–0.4)
Eos: 2 %
Hematocrit: 41.4 % (ref 34.0–46.6)
Hemoglobin: 13.5 g/dL (ref 11.1–15.9)
Immature Grans (Abs): 0 10*3/uL (ref 0.0–0.1)
Immature Granulocytes: 0 %
Lymphocytes Absolute: 1.5 10*3/uL (ref 0.7–3.1)
Lymphs: 31 %
MCH: 28.5 pg (ref 26.6–33.0)
MCHC: 32.6 g/dL (ref 31.5–35.7)
MCV: 87 fL (ref 79–97)
Monocytes Absolute: 0.4 10*3/uL (ref 0.1–0.9)
Monocytes: 9 %
Neutrophils Absolute: 2.8 10*3/uL (ref 1.4–7.0)
Neutrophils: 57 %
Platelets: 241 10*3/uL (ref 150–450)
RBC: 4.74 x10E6/uL (ref 3.77–5.28)
RDW: 13.2 % (ref 11.7–15.4)
WBC: 4.8 10*3/uL (ref 3.4–10.8)

## 2024-02-23 LAB — COMPREHENSIVE METABOLIC PANEL WITH GFR
ALT: 20 IU/L (ref 0–32)
AST: 17 IU/L (ref 0–40)
Albumin: 4.3 g/dL (ref 3.8–4.9)
Alkaline Phosphatase: 113 IU/L (ref 44–121)
BUN/Creatinine Ratio: 17 (ref 9–23)
BUN: 12 mg/dL (ref 6–24)
Bilirubin Total: 0.3 mg/dL (ref 0.0–1.2)
CO2: 22 mmol/L (ref 20–29)
Calcium: 8.7 mg/dL (ref 8.7–10.2)
Chloride: 103 mmol/L (ref 96–106)
Creatinine, Ser: 0.72 mg/dL (ref 0.57–1.00)
Globulin, Total: 2.4 g/dL (ref 1.5–4.5)
Glucose: 103 mg/dL — ABNORMAL HIGH (ref 70–99)
Potassium: 4.2 mmol/L (ref 3.5–5.2)
Sodium: 139 mmol/L (ref 134–144)
Total Protein: 6.7 g/dL (ref 6.0–8.5)
eGFR: 96 mL/min/{1.73_m2} (ref >59)

## 2024-02-23 LAB — LIPID PANEL
Chol/HDL Ratio: 2.8 ratio (ref 0.0–4.4)
Cholesterol, Total: 197 mg/dL (ref 100–199)
HDL: 70 mg/dL (ref >39)
LDL Chol Calc (NIH): 110 mg/dL — ABNORMAL HIGH (ref 0–99)
Triglycerides: 95 mg/dL (ref 0–149)
VLDL Cholesterol Cal: 17 mg/dL (ref 5–40)

## 2024-02-23 LAB — HEMOGLOBIN A1C
Est. average glucose Bld gHb Est-mCnc: 126 mg/dL
Hgb A1c MFr Bld: 6 % — ABNORMAL HIGH (ref 4.8–5.6)

## 2024-02-23 LAB — HEPATITIS C ANTIBODY: Hep C Virus Ab: NONREACTIVE

## 2024-02-23 LAB — HIV ANTIBODY (ROUTINE TESTING W REFLEX)

## 2024-02-28 ENCOUNTER — Ambulatory Visit: Payer: BC Managed Care – PPO | Admitting: Family Medicine

## 2024-03-06 ENCOUNTER — Other Ambulatory Visit (HOSPITAL_BASED_OUTPATIENT_CLINIC_OR_DEPARTMENT_OTHER): Payer: Self-pay

## 2024-03-06 ENCOUNTER — Ambulatory Visit: Admitting: Family Medicine

## 2024-03-06 ENCOUNTER — Encounter: Payer: Self-pay | Admitting: Family Medicine

## 2024-03-06 VITALS — BP 129/84 | HR 75 | Ht 62.0 in | Wt 243.0 lb

## 2024-03-06 DIAGNOSIS — I10 Essential (primary) hypertension: Secondary | ICD-10-CM | POA: Diagnosis not present

## 2024-03-06 DIAGNOSIS — R7303 Prediabetes: Secondary | ICD-10-CM

## 2024-03-06 DIAGNOSIS — Z6841 Body Mass Index (BMI) 40.0 and over, adult: Secondary | ICD-10-CM

## 2024-03-06 MED ORDER — TIRZEPATIDE-WEIGHT MANAGEMENT 2.5 MG/0.5ML ~~LOC~~ SOAJ
2.5000 mg | SUBCUTANEOUS | 0 refills | Status: DC
Start: 2024-03-06 — End: 2024-06-05
  Filled 2024-03-06: qty 2, 28d supply, fill #0

## 2024-03-06 NOTE — Progress Notes (Signed)
   Established Patient Office Visit  Subjective   Patient ID: Brianna Odom, female    DOB: 02/22/1966  Age: 58 y.o. MRN: [id number]  Chief Complaint  Patient presents with   Medical Management of Chronic Issues    HPI  Patient here is here today for follow-up of her chronic issues, mostly high blood pressure.  She also has prediabetes.  And mildly elevated cholesterol.  We discussed her blood pressure medications.  She is taking valsartan, hydrochlorothiazide and amlodipine.  No issues with these.  Not taking a statin and not interested in taking a statin.  We discussed weight loss medications including Contrave, phentermine, GLP-1 agonist.  She has comorbidities including elevated cholesterol, hypertension, BMI is 44.  Her A1c is continuing to rise and would likely eventually become diabetic if changes to her her diet and weight are not made.  She has a family history of diabetes in her father.  We discussed dietary and exercise programs.  Patient agreeable to using a calorie counting app to keep track of her calories, maintain a caloric deficit of initially 500 cal/day with a goal of 1 to 2 pounds of weight loss per week.  We discussed and patient is agreeable to exercise in the form of 150 minutes a week however she splits that up of moderate intensity cardiovascular exercise or at minimum 10,000 steps per day.  The 10-year ASCVD risk score (Arnett DK, et al., 2019) is: 3.4%  Health Maintenance Due  Topic Date Due   COVID-19 Vaccine (1) Never done      Objective:     BP 129/84   Pulse 75   Ht 5\' 2"  (1.575 m)   Wt 243 lb (110.2 kg)   SpO2 97%   BMI 44.45 kg/m    Physical Exam General: Alert, oriented.  Obese appearing female. Pulmonary: No respiratory distress GI: Large pannus Psych: Pleasant affect   No results found for any visits on 03/06/24.      Assessment & Plan:   Essential hypertension Assessment & Plan: Controlled with amlodipine, valsartan and  hydrochlorothiazide.  Continue these medications.  Recent BMP was normal.   Morbid obesity with BMI of 40.0-44.9, adult [facility]) Assessment & Plan: High cholesterol, hypertension, prediabetes.  A1c is now 6 and above consistently.  Her blood pressure is controlled on 3 medications.  She has a family history of diabetes.  She has agreed to calorie reduced diet of initially 500 cal/day reduction from her baseline and increasing as needed with goal of 1 to 2 pounds of weight loss per week.  Agreeable to exercise 150 minutes/week of moderate intensity cardiovascular work as she is able to fit it into her work schedule.  Sending in Zepbound 2.5 mg.  Demonstrated how to use it with patient using demo model.  Sending him to [facility]   Prediabetes Assessment & Plan: A1c still above 6.  Prescribing Zepbound for morbid obesity and prediabetes.  Hopefully we are able to get this approved for her.   Other orders -     Tirzepatide-Weight Management; Inject 2.5 mg into the skin once a week.  Dispense: 2 mL; Refill: 0     Return in about 3 months (around 06/05/2024) for weight.    Sandre Kitty, MD

## 2024-03-06 NOTE — Patient Instructions (Signed)
 It was nice to see you today,  We addressed the following topics today: -I am sending in your Zepbound prescription to the [facility].  When it is ready someone will let you know.  It may take several weeks to get approved if approved at all - For diet I would recommend using a calorie counting app such as my fitness pal, lose it or cronometer to monitor and keep a diary of your daily caloric intake.  Do this for 1 week to get a baseline daily average for calories.  After that you can subtract 500 cal from this to get your caloric deficit. - You should also exercise 150 minutes/week whether that be 30 minutes x 5 days a week or 20 minutes x 7 days a week or at minimum 10,000 steps per day if you cannot dedicate time for exercise.   Have a great day,  Etha Henle, MD

## 2024-03-06 NOTE — Assessment & Plan Note (Addendum)
 High cholesterol, hypertension, prediabetes.  A1c is now 6 and above consistently.  Her blood pressure is controlled on 3 medications.  She has a family history of diabetes.  She has agreed to calorie reduced diet of initially 500 cal/day reduction from her baseline and increasing as needed with goal of 1 to 2 pounds of weight loss per week.  Agreeable to exercise 150 minutes/week of moderate intensity cardiovascular work as she is able to fit it into her work schedule.  Sending in Zepbound 2.5 mg.  Demonstrated how to use it with patient using demo model.  Sending him to [facility] pharmacy

## 2024-03-06 NOTE — Assessment & Plan Note (Signed)
 Controlled with amlodipine, valsartan and hydrochlorothiazide.  Continue these medications.  Recent BMP was normal.

## 2024-03-06 NOTE — Assessment & Plan Note (Addendum)
 A1c still above 6.  Prescribing Zepbound for morbid obesity and prediabetes.  Hopefully we are able to get this approved for her.

## 2024-03-16 ENCOUNTER — Other Ambulatory Visit (HOSPITAL_BASED_OUTPATIENT_CLINIC_OR_DEPARTMENT_OTHER): Payer: Self-pay

## 2024-04-03 ENCOUNTER — Ambulatory Visit: Payer: BC Managed Care – PPO | Admitting: Dermatology

## 2024-04-03 ENCOUNTER — Encounter: Payer: Self-pay | Admitting: Dermatology

## 2024-04-03 VITALS — BP 141/91 | HR 80

## 2024-04-03 DIAGNOSIS — L719 Rosacea, unspecified: Secondary | ICD-10-CM | POA: Diagnosis not present

## 2024-04-03 DIAGNOSIS — L710 Perioral dermatitis: Secondary | ICD-10-CM | POA: Diagnosis not present

## 2024-04-03 MED ORDER — IVERMECTIN 1 % EX CREA: 1.0000 | TOPICAL_CREAM | Freq: Every day | CUTANEOUS | 2 refills | Status: AC

## 2024-04-03 NOTE — Patient Instructions (Addendum)
 Hello Brianna Odom,  Thank you for visiting today. Here is a summary of the key instructions:  - Medications:   - Use ivermectin cream in the morning and at night   - Apply azelaic acid cream in the morning and at night   - Consider taking 50 mg doxycycline  daily (optional)  - Skincare Routine:   - Use Avene Gentle Cleanser (sample provided)   - Apply Avene repair cream at night after other treatments   - Use sunscreen daily  - Treatment Plan:   - Follow the new skincare routine every day   - Continue treatment for at least 3 months to clear skin   - Maintain usage after skin clears  - Follow-up:   - Contact the office through MyChart with any questions  Please reach out if you have any questions or concerns.  Warm regards,  Dr. Louana Roup, Dermatology    Important Information  Due to recent changes in healthcare laws, you may see results of your pathology and/or laboratory studies on MyChart before the doctors have had a chance to review them. We understand that in some cases there may be results that are confusing or concerning to you. Please understand that not all results are received at the same time and often the doctors may need to interpret multiple results in order to provide you with the best plan of care or course of treatment. Therefore, we ask that you please give us  2 business days to thoroughly review all your results before contacting the office for clarification. Should we see a critical lab result, you will be contacted sooner.   If You Need Anything After Your Visit  If you have any questions or concerns for your doctor, please call our main line at [phone] If no one answers, please leave a voicemail as directed and we will return your call as soon as possible. Messages left after 4 pm will be answered the following business day.   You may also send us  a message via MyChart. We typically respond to MyChart messages within 1-2 business days.  For  prescription refills, please ask your pharmacy to contact our office. Our fax number is [phone].  If you have an urgent issue when the clinic is closed that cannot wait until the next business day, you can page your doctor at the number below.    Please note that while we do our best to be available for urgent issues outside of office hours, we are not available 24/7.   If you have an urgent issue and are unable to reach us , you may choose to seek medical care at your doctor's office, retail clinic, urgent care center, or emergency room.  If you have a medical emergency, please immediately call 911 or go to the emergency department. In the event of inclement weather, please call our main line at [phone] for an update on the status of any delays or closures.  Dermatology Medication Tips: Please keep the boxes that topical medications come in in order to help keep track of the instructions about where and how to use these. Pharmacies typically print the medication instructions only on the boxes and not directly on the medication tubes.   If your medication is too expensive, please contact our office at [phone] or send us  a message through MyChart.   We are unable to tell what your co-pay for medications will be in advance as this is different depending on your insurance coverage. However, we  may be able to find a substitute medication at lower cost or fill out paperwork to get insurance to cover a needed medication.   If a prior authorization is required to get your medication covered by your insurance company, please allow us  1-2 business days to complete this process.  Drug prices often vary depending on where the prescription is filled and some pharmacies may offer cheaper prices.  The website www.goodrx.com contains coupons for medications through different pharmacies. The prices here do not account for what the cost may be with help from insurance (it may be cheaper with your  insurance), but the website can give you the price if you did not use any insurance.  - You can print the associated coupon and take it with your prescription to the pharmacy.  - You may also stop by our office during regular business hours and pick up a GoodRx coupon card.  - If you need your prescription sent electronically to a different pharmacy, notify our office through [facility] or by phone at [phone]

## 2024-04-03 NOTE — Progress Notes (Signed)
   Follow-Up Visit   Subjective  Brianna Odom is a 58 y.o. female who presents for the following: Perioral dermatitis  Patient present today for follow up visit for perioral dermatitis  . Patient was last evaluated on 10/04/2023. At this visit patient was prescribed doxycyline . Patient reports sxs are improved but not at goal. Patient denies medication changes. Patient reports that she is using azelaic .  The following portions of the chart were reviewed this encounter and updated as appropriate: medications, allergies, medical history  Review of Systems:  No other skin or systemic complaints except as noted in HPI or Assessment and Plan.  Objective  Well appearing patient in no apparent distress; mood and affect are within normal limits.     A focused examination was performed of the following areas: Face  Relevant exam findings are noted in the Assessment and Plan.    Assessment & Plan    1. Rosacea with perioral dermatitis - Assessment: Patient reports worsening of symptoms since last visit in November. Has been using azelaic acid cream inconsistently and taking doxycycline  periodically for flares. Bumps appear more pronounced today, suggesting a significant rosacea component. Sensitivity to demodex mites may be contributing to the condition. Patient reports improvement when using antibiotics and occasional itching.  - Plan:    Start ivermectin topical, apply morning and night    Continue azelaic acid cream, apply morning and night    Adding doxycycline  50 mg PO daily (low-dose) for faster clearance    Samples provided:     - Avene Gentle Cleanser     - Avene repair cream for nighttime use    Apply treatments in order: ivermectin, azelaic acid, repair cream (especially on itchy, sensitive areas)    Continue current sunscreen use    Educate on importance of daily, consistent use of topical treatments    Anticipate 3 months for skin clearance    Maintain treatment regimen  after clearance    Follow up as needed, patient to contact via MyChart with questions  Follow-up as needed for any unresolved or worsening issues.    Return in about 4 months (around 08/04/2024).  Exie Holler, CMA, am acting as scribe for [employer], DO.   Documentation: I have reviewed the above documentation for accuracy and completeness, and I agree with the above.  Louana Roup, DO

## 2024-05-04 ENCOUNTER — Other Ambulatory Visit: Payer: Self-pay | Admitting: Family Medicine

## 2024-05-04 DIAGNOSIS — I10 Essential (primary) hypertension: Secondary | ICD-10-CM

## 2024-05-04 DIAGNOSIS — F411 Generalized anxiety disorder: Secondary | ICD-10-CM

## 2024-06-05 ENCOUNTER — Ambulatory Visit: Admitting: Family Medicine

## 2024-06-05 VITALS — BP 155/87 | HR 86 | Ht 62.0 in | Wt 244.0 lb

## 2024-06-05 DIAGNOSIS — Z6841 Body Mass Index (BMI) 40.0 and over, adult: Secondary | ICD-10-CM

## 2024-06-05 DIAGNOSIS — R760 Raised antibody titer: Secondary | ICD-10-CM | POA: Diagnosis not present

## 2024-06-05 DIAGNOSIS — H539 Unspecified visual disturbance: Secondary | ICD-10-CM | POA: Diagnosis not present

## 2024-06-05 NOTE — Assessment & Plan Note (Signed)
 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 Has seen multiple specialists (Dr. Waylan, Dr. Josh, Dr. Laurence) with differing opinions (occlusion vs. tissue on the nerve). Awaiting appointment with Dr. Vicky at [facility] for further evaluation. - Continue to follow up with ophthalmology as planned. - Follow up here in 2 months to review test results and progress with specialists.

## 2024-06-05 NOTE — Progress Notes (Unsigned)
 Established Patient Office Visit  Subjective   Patient ID: Brianna Odom, female    DOB: September 07, 1966  Age: 58 y.o. MRN: [id number]  Chief Complaint  Patient presents with   Medical Management of Chronic Issues    HPI  Subjective - Reports ongoing ophthalmology workup for about 1.5 years due to elevated eye pressures. - Under care of ophthalmologist Dr. Waylan. 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 - Referred to retina specialist Dr. Josh, who performed multiple tests but could not determine the cause. - Subsequently referred to Dr. Laurence at [facility], who believes something is laying on the optic nerve rather than an issue with the nerve itself. Describes it as tissue or similar to an angioma. Dr. Alica had previously considered an occlusion. - Reports having shady shadows in the left eye. Otherwise asymptomatic. - Awaiting an appointment with another retina specialist, Dr. Vicky, at [facility]. - Dr. Josh ordered blood tests to rule out a hematological cause. Results showed an elevated anticardiolipin IgM. Was informed of this about a month after the test. - Discussed weight loss medication previously, but it was not covered by insurance and cost $1000/month. This has been deferred due to the current eye issues.  Medications Uses an estrogen patch for several years for hot flashes. Notes hot flashes return upon cessation.  PMH, PSH, FH, Social Hx No personal or family history of lupus or other autoimmune disorders.  ROS - Vision: Reports shady shadows in the left eye. Right eye is fine. Denies other visual symptoms. - Constitutional: Denies feeling unwell.   The 10-year ASCVD risk score (Arnett DK, et al., 2019) is: 5.4%  Health Maintenance Due  Topic Date Due   COVID-19 Vaccine (1) Never done      Objective:     BP (!) 155/87   Pulse 86   Ht 5' 2 (1.575 m)   Wt 244 lb (110.7 kg)   SpO2 98%   BMI 44.63  kg/m    Physical Exam Gen: alert, oriented Pulm: no resp distress Psych: pleasant affect   No results found for any visits on 06/05/24.      Assessment & Plan:   Anti-cardiolipin antibody positive Assessment & Plan: Patient with a complex ophthalmologic issue, presenting for follow-up on recent bloodwork ordered by her retina specialist. Workup revealed a moderately elevated anticardiolipin IgM. All other clotting factors and autoimmune markers were negative. This is not thought to be related to the chronic eye issue. Per the lab interpretation, this does not meet criteria for antiphospholipid syndrome and may be a transient elevation. - Repeat anticardiolipin IgM test. - Discontinue estrogen patch temporarily due to increased clotting risk until repeat test results are available. Will restart if the test is negative. - If the repeat test is positive, will refer to hematology.  Orders: -     Cardiolipin antibodies, IgG, IgM, IgA; Future  Morbid obesity with BMI of 40.0-44.9, adult [facility]) Assessment & Plan: Discussed weight loss medications. Previously interested in GLP-1 agonists, but cost was prohibitive ($1000/month). Unsure if this was due to non-coverage or a high co-pay. - Advised to contact the pharmacist or insurance to clarify if Zepbound  is covered and if the $1000 price is a co-pay. - If it is a covered medication, advised can use a manufacturer's co-pay card to reduce the cost. - Counseled on common side effects of GLP-1 agonists, including nausea, vomiting, constipation, and bloating. Discussed that Zepbound /Mounjaro  seems  to be better tolerated by some compared to Ozempic/Wegovy . - Plan to defer initiation of weight loss medication until ophthalmology and hematology issues are clarified.   Vision changes Assessment & Plan: Ongoing workup for 1.5 years for an abnormality of the left optic nerve, presenting as a lip and causing shady shadows. Has seen multiple  specialists (Dr. Waylan, Dr. Josh, Dr. Laurence) with differing opinions (occlusion vs. tissue on the nerve). Awaiting appointment with Dr. Vicky at [facility] for further evaluation. - Continue to follow up with ophthalmology as planned. - Follow up here in 2 months to review test results and progress with specialists.      Return in about 2 months (around 08/06/2024) for weight.    Toribio MARLA Slain, MD

## 2024-06-05 NOTE — Assessment & Plan Note (Signed)
 Patient with a complex ophthalmologic issue, presenting for follow-up on recent bloodwork ordered by her retina specialist. Workup revealed a moderately elevated anticardiolipin IgM. All other clotting factors and autoimmune markers were negative. This is not thought to be related to the chronic eye issue. Per the lab interpretation, this does not meet criteria for antiphospholipid syndrome and may be a transient elevation. - Repeat anticardiolipin IgM test. - Discontinue estrogen patch temporarily due to increased clotting risk until repeat test results are available. Will restart if the test is negative. - If the repeat test is positive, will refer to hematology.

## 2024-06-05 NOTE — Assessment & Plan Note (Signed)
 Discussed weight loss medications. Previously interested in GLP-1 agonists, but cost was prohibitive ($1000/month). Unsure if this was due to non-coverage or a high co-pay. - Advised to contact the pharmacist or insurance to clarify if Zepbound  is covered and if the $1000 price is a co-pay. - If it is a covered medication, advised can use a manufacturer's co-pay card to reduce the cost. - Counseled on common side effects of GLP-1 agonists, including nausea, vomiting, constipation, and bloating. Discussed that Zepbound /Mounjaro  seems to be better tolerated by some compared to Ozempic/Wegovy . - Plan to defer initiation of weight loss medication until ophthalmology and hematology issues are clarified.

## 2024-06-05 NOTE — Patient Instructions (Signed)
 It was nice to see you today,  We addressed the following topics today: -I am going to repeat your anticardiolipin test.  I will talk about the result when I get it - Until then we can hold off on your estrogen patch - Talk to your pharmacist and asked them if that previous prescription for Zepbound  was covered by insurance and if that was the co-pay price.  If it was covered by insurance then you can use a co-pay card from the manufacturer website.  If it was not covered by insurance then there is less we can do about it.  Have a great day,  Rolan Slain, MD

## 2024-06-07 ENCOUNTER — Other Ambulatory Visit: Payer: Self-pay | Admitting: Family Medicine

## 2024-06-08 LAB — CARDIOLIPIN ANTIBODIES, IGG, IGM, IGA
Anticardiolipin IgA: <9 U/mL (ref 0–11)
Anticardiolipin IgG: <9 GPL U/mL (ref 0–14)
Anticardiolipin IgM: 21 [MPL'U]/mL — ABNORMAL HIGH (ref 0–12)

## 2024-06-12 ENCOUNTER — Ambulatory Visit: Payer: Self-pay | Admitting: Family Medicine

## 2024-08-07 ENCOUNTER — Ambulatory Visit: Admitting: Family Medicine

## 2024-08-07 ENCOUNTER — Ambulatory Visit: Admitting: Dermatology

## 2024-08-09 ENCOUNTER — Other Ambulatory Visit: Payer: Self-pay | Admitting: Nurse Practitioner

## 2024-08-09 DIAGNOSIS — Z1231 Encounter for screening mammogram for malignant neoplasm of breast: Secondary | ICD-10-CM

## 2024-08-10 ENCOUNTER — Other Ambulatory Visit: Payer: Self-pay | Admitting: Nurse Practitioner

## 2024-08-10 DIAGNOSIS — Z79890 Hormone replacement therapy (postmenopausal): Secondary | ICD-10-CM

## 2024-08-10 NOTE — Telephone Encounter (Signed)
 Med refill request: estradiol  (vivelle -dot) 0.025 mg/24hr patch Last AEX: 08/09/23 TW Next AEX: 10/16/24 TW Last MMG (if hormonal med) 06/28/23, scheduled 08/28/24 Refill authorized: Last Rx sent #[id number] (84 day supply) with 3 refills on 08/09/23 TW. Please approve or deny

## 2024-08-28 ENCOUNTER — Ambulatory Visit
Admission: RE | Admit: 2024-08-28 | Discharge: 2024-08-28 | Disposition: A | Source: Ambulatory Visit | Attending: Nurse Practitioner

## 2024-08-28 DIAGNOSIS — Z1231 Encounter for screening mammogram for malignant neoplasm of breast: Secondary | ICD-10-CM

## 2024-09-11 ENCOUNTER — Ambulatory Visit: Admitting: Family Medicine

## 2024-09-11 ENCOUNTER — Encounter: Payer: Self-pay | Admitting: Family Medicine

## 2024-09-11 VITALS — BP 119/77 | HR 85 | Ht 62.0 in

## 2024-09-11 DIAGNOSIS — R5383 Other fatigue: Secondary | ICD-10-CM

## 2024-09-11 DIAGNOSIS — Z23 Encounter for immunization: Secondary | ICD-10-CM

## 2024-09-11 DIAGNOSIS — Z6841 Body Mass Index (BMI) 40.0 and over, adult: Secondary | ICD-10-CM | POA: Diagnosis not present

## 2024-09-11 MED ORDER — TIRZEPATIDE 2.5 MG/0.5ML ~~LOC~~ SOAJ: 2.5000 mg | SUBCUTANEOUS | 1 refills | Status: AC

## 2024-09-11 NOTE — Patient Instructions (Signed)
 It was nice to see you today,  We addressed the following topics today: - I have sent you a message on the patient portal. You can reply to that message to let me know the pharmacy information for the Zepbound  prescription. - I have ordered a home sleep study. The company will call you to arrange sending the equipment. Please follow their instructions to complete the test at home and mail it back. - Please schedule a follow-up appointment for 3 months from now. - I will give you a flu shot today before you leave.  Have a great day,  Rolan Slain, MD

## 2024-09-11 NOTE — Assessment & Plan Note (Signed)
-   Discussed starting GLP-1 agonist for weight management. Patient is interested in Zepbound . Discussed risks and benefits, including pancreatitis, which is a low risk given no personal history. Discussed options for obtaining medication, including out-of-pocket online pharmacies vs. seeking insurance approval. Discussed that insurance may cover the medication if a diagnosis of moderate-to-severe obstructive sleep apnea is made. - Will provide a paper prescription for Zepbound  2.5 mg subcutaneous injection weekly. - Will order a home sleep study to evaluate for obstructive sleep apnea. Counseled that a diagnosis may help with future insurance coverage for Zepbound . - Patient will follow up with an existing nutritional program associated with her medication source. - Follow up in 3 months.

## 2024-09-11 NOTE — Progress Notes (Signed)
   Established Patient Office Visit  Subjective   Patient ID: Nava Song, female    DOB: 1966/03/07  Age: 58 y.o. MRN: [id number]  Chief Complaint  Patient presents with   Medical Management of Chronic Issues    HPI  Subjective - Follow up on complex eye issue. 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 Last specialist recommended blood work for cat scratch fever and syphilis. Has not had the blood work done. Has a follow-up appointment with Dr. Govin in one month. - Request to start Zepbound  for obesity. Has researched an online source for the medication, potentially a compounding pharmacy, with pricing around [phone] per month. Unsure if a membership is required. - Reports snoring and constant fatigue. Denies daytime somnolence..  Medications Currently on hormonal medication for menopause, restarted after a period of discontinuation. Anticardiolipin antibody tests were normal.  PMH, PSH, FH, Social Hx PMHx: History of kidney stone. No history of heart attack, stroke, peripheral artery disease, stents, or pancreatitis. No known history of hepatic steatosis. Denies history of gallbladder problems. FHx: Sister is on a weight loss medication and experienced a severe episode of abdominal pain, presumed to be a gallbladder attack.  ROS Constitutional: Reports constant fatigue. Cardiovascular: Denies history of heart attack, stroke, PAD. Gastrointestinal: Denies history of pancreatitis or gallbladder disease.    The 10-year ASCVD risk score (Arnett DK, et al., 2019) is: 3.2%  Health Maintenance Due  Topic Date Due   COVID-19 Vaccine (1) Never done   Pneumococcal Vaccine: 50+ Years (1 of 1 - PCV) Never done      Objective:     BP 119/77   Pulse 85   Ht 5' 2 (1.575 m)   SpO2 97%   BMI 44.63 kg/m    Physical Exam Gen: alert, oriented Pulm: no respiratory distress Psych: pleasant affect   No results found for any visits on  09/11/24.      Assessment & Plan:   Morbid obesity with BMI of 40.0-44.9, adult [facility]) Assessment & Plan: - Discussed starting GLP-1 agonist for weight management. Patient is interested in Zepbound . Discussed risks and benefits, including pancreatitis, which is a low risk given no personal history. Discussed options for obtaining medication, including out-of-pocket online pharmacies vs. seeking insurance approval. Discussed that insurance may cover the medication if a diagnosis of moderate-to-severe obstructive sleep apnea is made. - Will provide a paper prescription for Zepbound  2.5 mg subcutaneous injection weekly. - Will order a home sleep study to evaluate for obstructive sleep apnea. Counseled that a diagnosis may help with future insurance coverage for Zepbound . - Patient will follow up with an existing nutritional program associated with her medication source. - Follow up in 3 months.   Other fatigue -     Home sleep test  Immunization due -     Flu vaccine trivalent PF, 6mos and older(Flulaval,Afluria,Fluarix,Fluzone)  Other orders -     Tirzepatide ; Inject 2.5 mg into the skin once a week.  Dispense: 2 mL; Refill: 1     No follow-ups on file.    Toribio MARLA Slain, MD

## 2024-10-13 NOTE — Progress Notes (Deleted)
 Brianna Odom 07-15-1966 [id number]   History:  58 y.o. G2P2002 presents for annual exam. Postmenopausal - on ERT for vasomotor symptoms with good management. Normal pap history. S/P 2002 TVH for benign reasons. HTN, anxiety managed by PCP.   Gynecologic History No LMP recorded. Patient has had a hysterectomy.   Contraception/Family planning: status post hysterectomy Sexually active: Yes  Health Maintenance Last Pap: No longer screening Last mammogram: 08/28/2024. Results were: Normal Last colonoscopy: 04/15/2016. Results were: Normal, 10-year recall Last Dexa: Not indicated     09/11/2024    2:32 PM  Depression screen PHQ 2/9  Decreased Interest 0  Down, Depressed, Hopeless 0  PHQ - 2 Score 0  Altered sleeping 0  Tired, decreased energy 0  Change in appetite 0  Feeling bad or failure about yourself  0  Trouble concentrating 0  Moving slowly or fidgety/restless 0  Suicidal thoughts 0  PHQ-9 Score 0      Data saved with a previous flowsheet row definition     Past medical history, past surgical history, family history and social history were all reviewed and documented in the EPIC chart. 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 Daughter and son in [place].   ROS:  A ROS was performed and pertinent positives and negatives are included.  Exam:  There were no vitals filed for this visit.  There is no height or weight on file to calculate BMI.  General appearance:  Normal Thyroid:  Symmetrical, normal in size, without palpable masses or nodularity. Respiratory  Auscultation:  Clear without wheezing or rhonchi Cardiovascular  Auscultation:  Regular rate, without rubs, murmurs or gallops  Edema/varicosities:  Not grossly evident Abdominal  Soft,nontender, without masses, guarding or rebound.  Liver/spleen:  No organomegaly noted  Hernia:  None appreciated  Skin  Inspection:  Grossly normal Breasts: Examined lying and sitting.   Right: Without masses, retractions, nipple discharge or  axillary adenopathy.   Left: Without masses, retractions, nipple discharge or axillary adenopathy. Pelvic: External genitalia:  no lesions              Urethra:  normal appearing urethra with no masses, tenderness or lesions              Bartholins and Skenes: normal                 Vagina: normal appearing vagina with normal color and discharge, no lesions              Cervix: Absent Bimanual Exam:  Uterus: Absent              Adnexa: no mass, fullness, tenderness              Rectovaginal: Deferred              Anus:  normal sphincter tone, no lesions   Patient informed chaperone available to be present for breast and pelvic exam. Patient has requested no chaperone to be present. Patient has been advised what will be completed during breast and pelvic exam.   Assessment/Plan:  58 y.o. [id number] for annual exam.   Well female exam with routine gynecological exam - Education provided on SBEs, importance of preventative screenings, current guidelines, high calcium diet, regular exercise, and multivitamin daily.  Labs with PCP.   Postmenopausal hormone therapy - Plan: estradiol  (VIVELLE -DOT) 0.025 MG/24HR twice weekly with good management. Aware of risks with continued use.   Screening for cervical cancer - Normal Pap history.  No longer screening per  guidelines.   Screening for breast cancer - Normal mammogram history.  Continue annual screenings.  Normal breast exam today.  Screening for colon cancer - 2017 colonoscopy. Will repeat at 10-year interval per GI's recommendation.   Screening for osteoporosis - Average risk. Will plan DXA at age 24.   No follow-ups on file.     Brianna DELENA Shutter DNP, 2:38 PM 10/13/2024

## 2024-10-16 ENCOUNTER — Ambulatory Visit: Payer: Self-pay | Admitting: Nurse Practitioner

## 2024-10-16 DIAGNOSIS — Z1331 Encounter for screening for depression: Secondary | ICD-10-CM

## 2024-10-16 DIAGNOSIS — Z01419 Encounter for gynecological examination (general) (routine) without abnormal findings: Secondary | ICD-10-CM

## 2024-10-16 DIAGNOSIS — Z79890 Hormone replacement therapy (postmenopausal): Secondary | ICD-10-CM

## 2024-11-13 ENCOUNTER — Other Ambulatory Visit: Payer: Self-pay | Admitting: Family Medicine

## 2024-11-13 DIAGNOSIS — F411 Generalized anxiety disorder: Secondary | ICD-10-CM

## 2024-11-13 DIAGNOSIS — I10 Essential (primary) hypertension: Secondary | ICD-10-CM

## 2024-12-18 ENCOUNTER — Ambulatory Visit: Admitting: Nurse Practitioner

## 2024-12-18 ENCOUNTER — Ambulatory Visit: Admitting: Family Medicine

## 2025-01-01 ENCOUNTER — Ambulatory Visit: Admitting: Dermatology

## 2025-01-22 ENCOUNTER — Ambulatory Visit: Admitting: Family Medicine

## 2025-01-22 ENCOUNTER — Ambulatory Visit: Admitting: Nurse Practitioner

## 2025-02-05 ENCOUNTER — Ambulatory Visit: Admitting: Nurse Practitioner
